# Patient Record
Sex: Male | Born: 1935 | Race: White | Hispanic: No | Marital: Married | Smoking: Never smoker
Health system: Southern US, Community
[De-identification: ages and names within clinical notes are randomized; demographics above are authoritative.]

## PROBLEM LIST (undated history)

## (undated) DIAGNOSIS — I639 Cerebral infarction, unspecified: Secondary | ICD-10-CM

## (undated) DIAGNOSIS — I482 Chronic atrial fibrillation, unspecified: Secondary | ICD-10-CM

## (undated) DIAGNOSIS — I259 Chronic ischemic heart disease, unspecified: Secondary | ICD-10-CM

## (undated) DIAGNOSIS — I10 Essential (primary) hypertension: Secondary | ICD-10-CM

## (undated) DIAGNOSIS — M25561 Pain in right knee: Secondary | ICD-10-CM

## (undated) HISTORY — DX: Chronic atrial fibrillation, unspecified: I48.20

## (undated) HISTORY — DX: Pain in right knee: M25.561

## (undated) HISTORY — PX: EAR CYST EXCISION: SHX22

## (undated) HISTORY — DX: Chronic ischemic heart disease, unspecified: I25.9

## (undated) HISTORY — DX: Essential (primary) hypertension: I10

## (undated) HISTORY — DX: Cerebral infarction, unspecified: I63.9

---
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
## 1994-02-12 HISTORY — PX: ROTATOR CUFF REPAIR: SHX139

## 1999-05-04 ENCOUNTER — Encounter: Payer: Self-pay | Admitting: Orthopedic Surgery

## 1999-05-04 ENCOUNTER — Encounter: Admission: RE | Admit: 1999-05-04 | Discharge: 1999-05-04 | Payer: Self-pay | Admitting: Orthopedic Surgery

## 2004-12-02 ENCOUNTER — Encounter: Admission: RE | Admit: 2004-12-02 | Discharge: 2004-12-02 | Payer: Self-pay | Admitting: Orthopedic Surgery

## 2004-12-06 ENCOUNTER — Encounter: Admission: RE | Admit: 2004-12-06 | Discharge: 2004-12-06 | Payer: Self-pay | Admitting: Orthopedic Surgery

## 2004-12-07 ENCOUNTER — Ambulatory Visit (HOSPITAL_BASED_OUTPATIENT_CLINIC_OR_DEPARTMENT_OTHER): Admission: RE | Admit: 2004-12-07 | Discharge: 2004-12-07 | Payer: Self-pay | Admitting: Orthopedic Surgery

## 2004-12-07 ENCOUNTER — Ambulatory Visit (HOSPITAL_COMMUNITY): Admission: RE | Admit: 2004-12-07 | Discharge: 2004-12-07 | Payer: Self-pay | Admitting: Orthopedic Surgery

## 2005-10-07 ENCOUNTER — Inpatient Hospital Stay (HOSPITAL_COMMUNITY): Admission: EM | Admit: 2005-10-07 | Discharge: 2005-10-17 | Payer: Self-pay | Admitting: Emergency Medicine

## 2005-10-08 ENCOUNTER — Encounter: Payer: Self-pay | Admitting: Vascular Surgery

## 2005-10-08 ENCOUNTER — Encounter: Payer: Self-pay | Admitting: Cardiology

## 2005-10-09 ENCOUNTER — Encounter: Payer: Self-pay | Admitting: Vascular Surgery

## 2005-11-15 ENCOUNTER — Encounter (HOSPITAL_COMMUNITY): Admission: RE | Admit: 2005-11-15 | Discharge: 2006-02-13 | Payer: Self-pay | Admitting: Cardiology

## 2006-02-14 ENCOUNTER — Encounter (HOSPITAL_COMMUNITY): Admission: RE | Admit: 2006-02-14 | Discharge: 2006-03-14 | Payer: Self-pay | Admitting: Cardiology

## 2006-10-16 IMAGING — CR DG CHEST 1V PORT
1 series · 1 of 1 positions shown · non-contrast
Comparison: 10/10/2005

CLINICAL DATA: Chest pain. CABG.

[view not recorded]
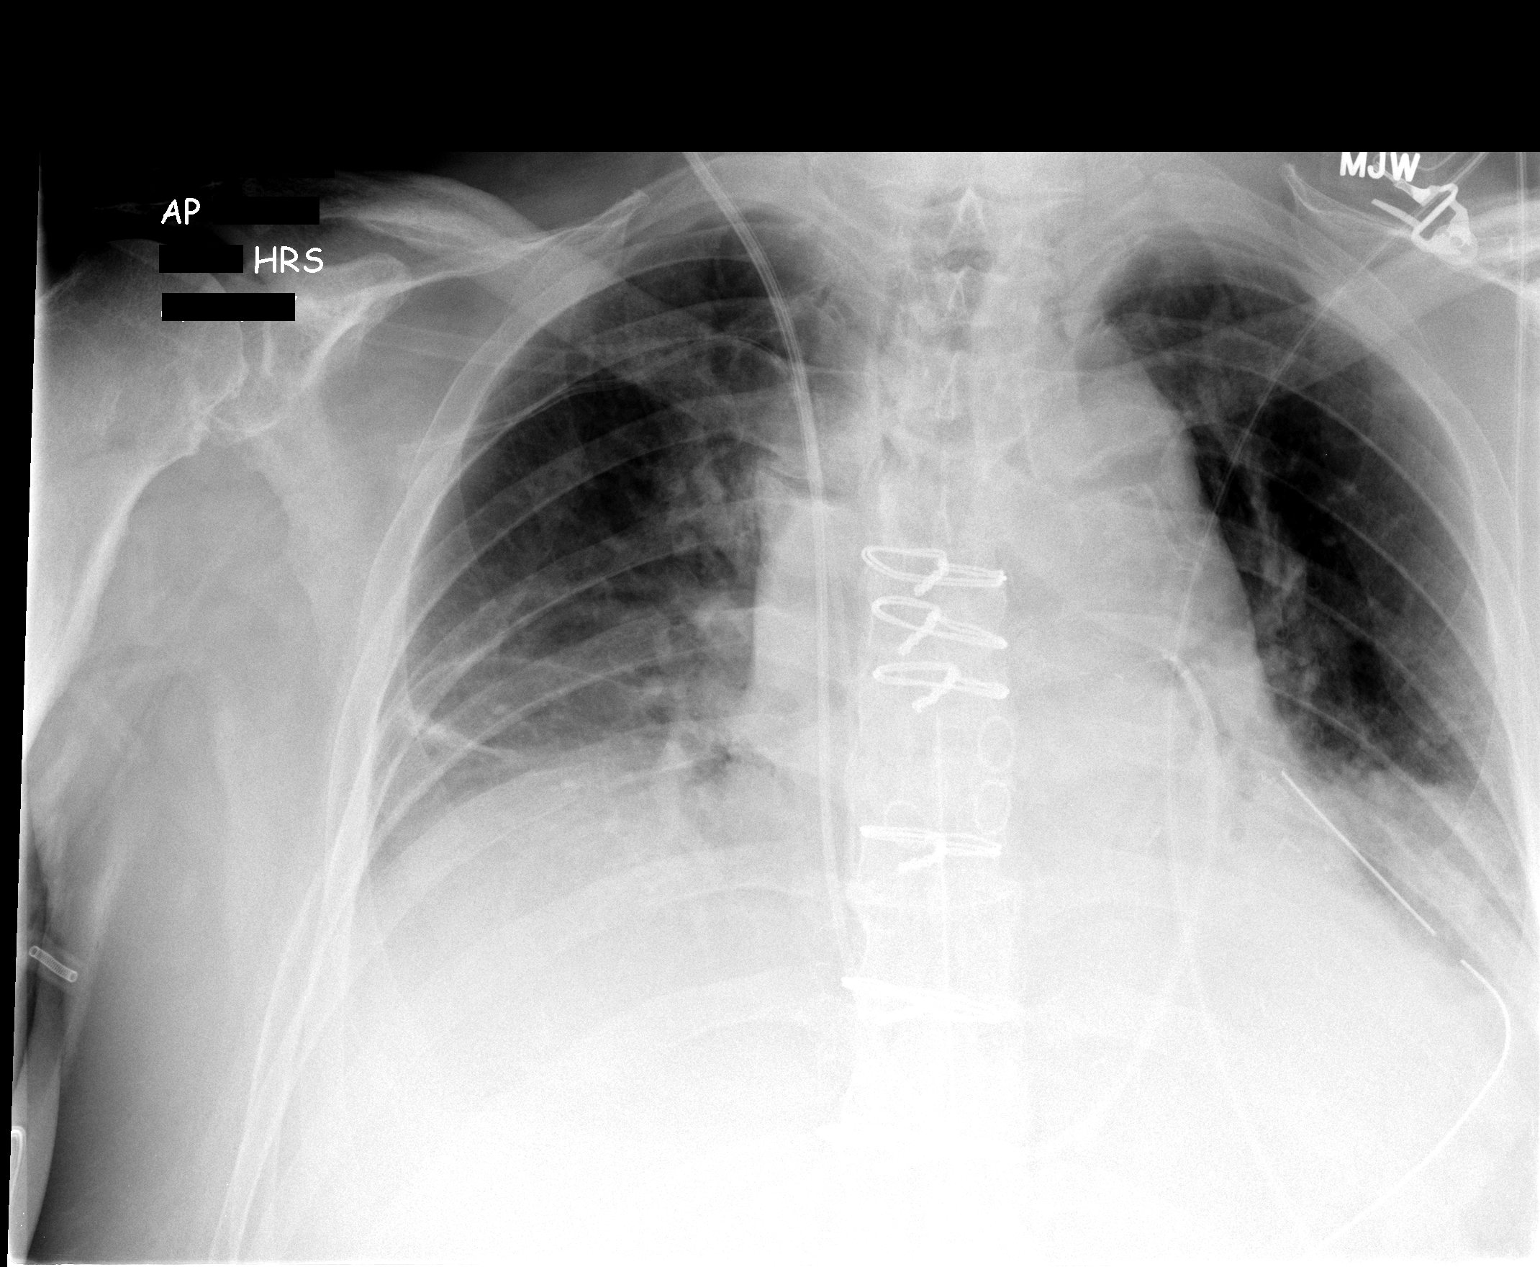

[1 of 1 positions shown; findings below may reference images not displayed]

PORTABLE CHEST - 1 VIEW:

9489 hours. Low volume film with bibasilar atelectasis. Endotracheal tube and NG
tube are removed in the interval. The left chest tube persists without
pneumothorax. Right IJ pulmonary catheter is stable. A single
mediastinal/pericardial drain is visible on this film. Right subclavian central
line remains in place.
IMPRESSION: Low volume film with mild vascular congestion and bibasilar atelectasis.

Interval extubation with removal of the NG tube.

## 2006-10-18 IMAGING — CR DG CHEST 2V
2 series · 2 of 2 positions shown · non-contrast
Comparison: 10/12/05.

CLINICAL DATA: Chest pain.  Post pleural tube removal. 
 CHEST ? 2 VIEW:

[view not recorded (1 of 2)]
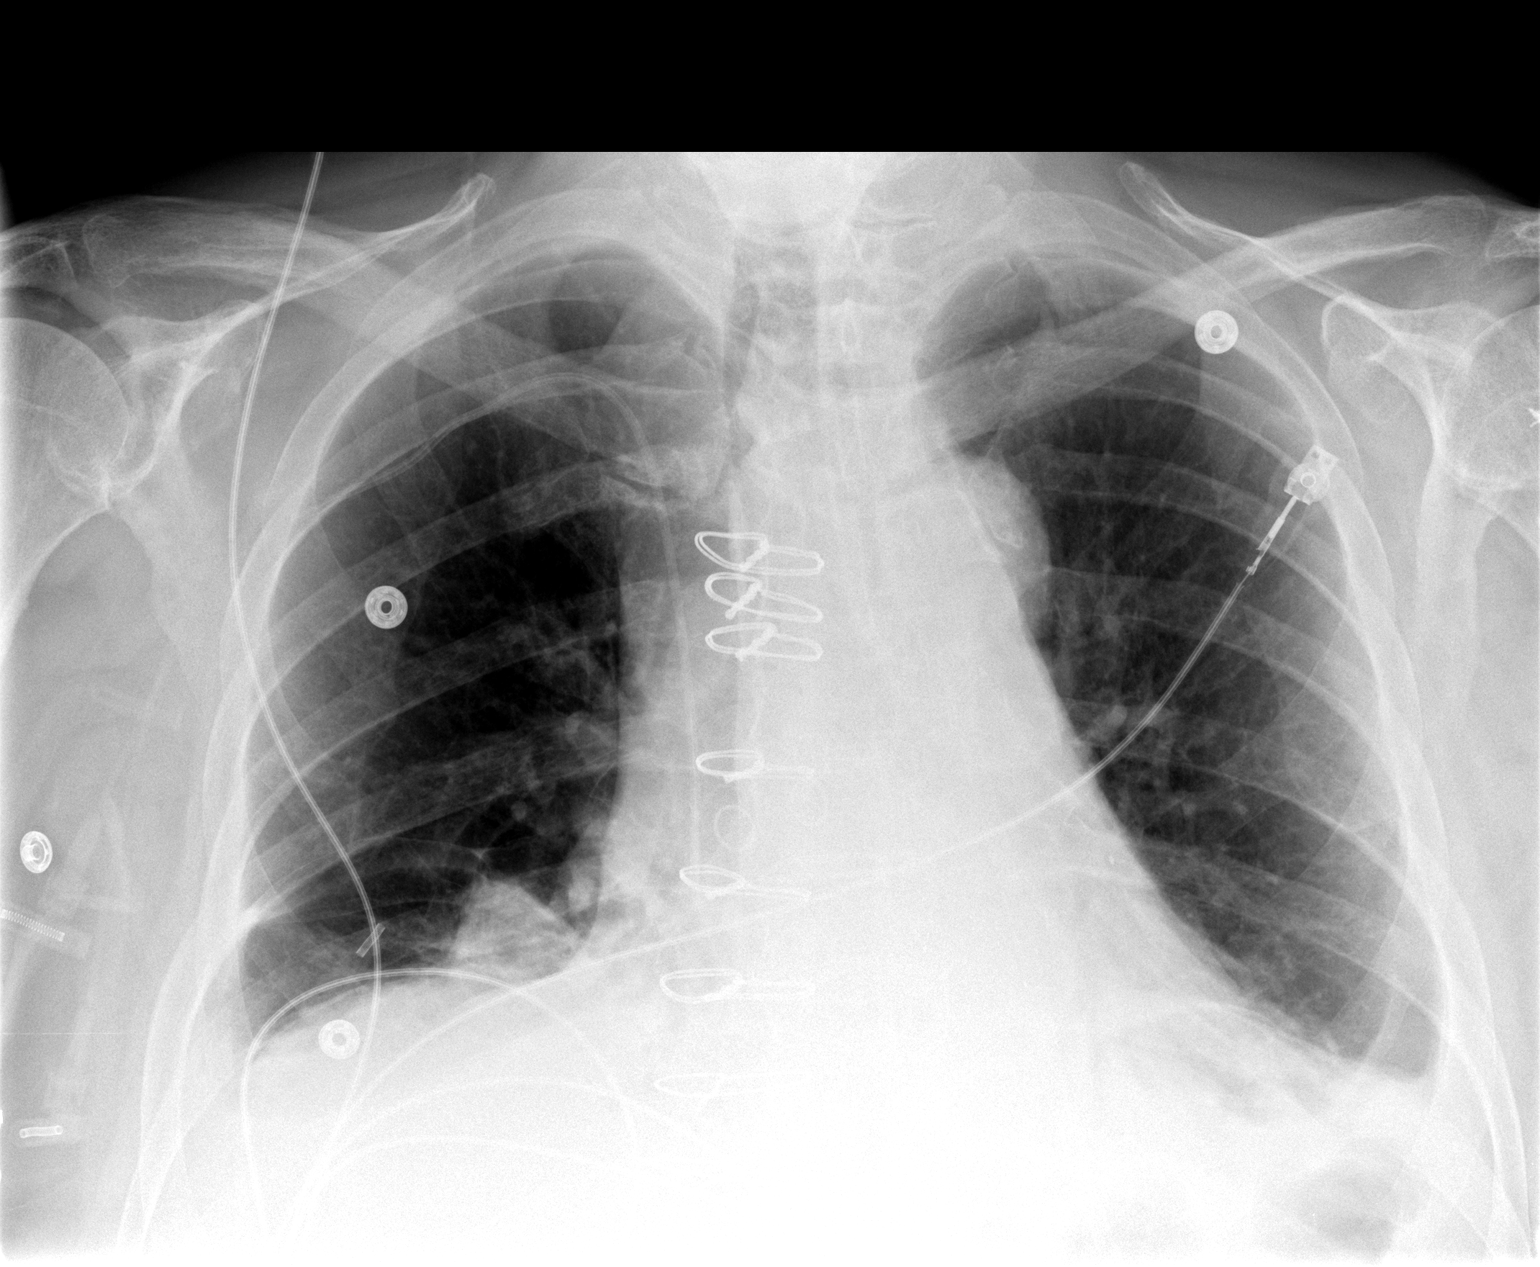

[view not recorded (2 of 2)]
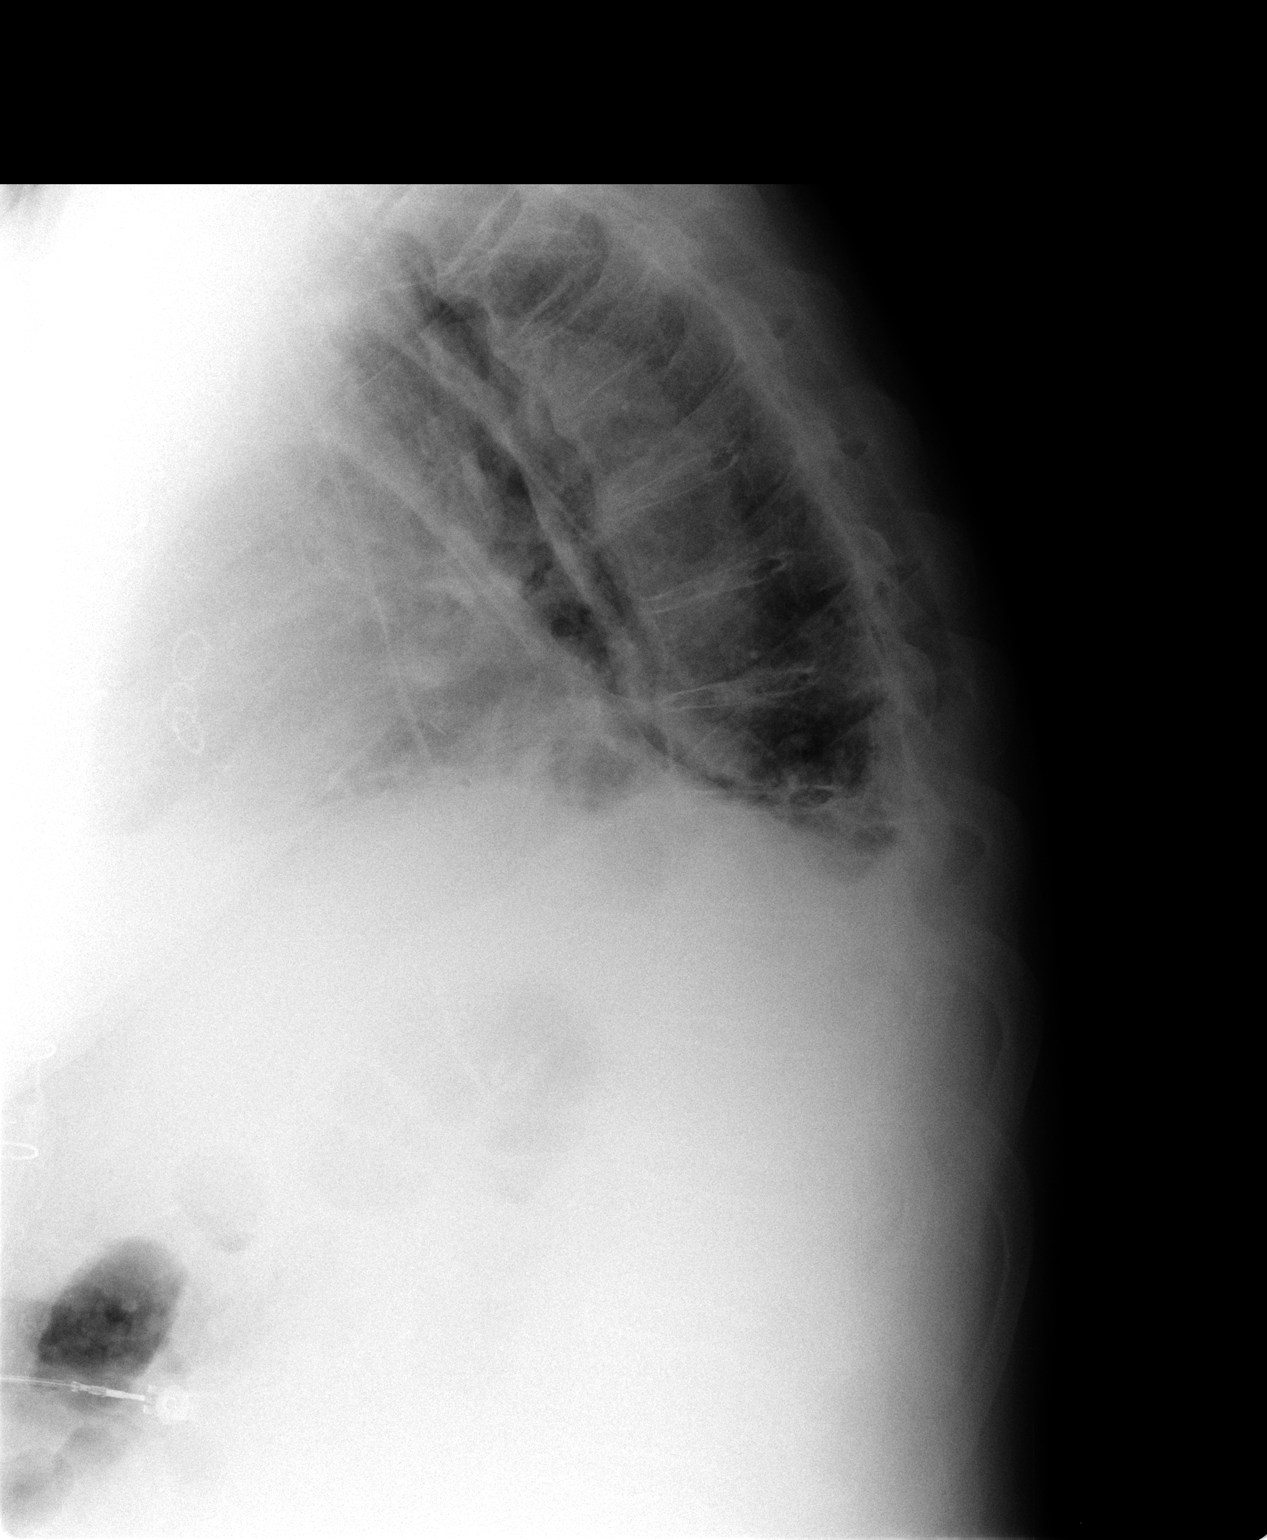

[2 of 2 positions shown; findings below may reference images not displayed]

FINDINGS: Right central venous catheter, cardiomegaly, and CABG again noted.  Improved basilar aeration with continued lower lung atelectasis/consolidation.  There is no evidence of pneumothorax.
IMPRESSION: Improved basilar aeration with stable small effusions.

## 2009-10-31 ENCOUNTER — Ambulatory Visit: Payer: Self-pay | Admitting: Cardiology

## 2009-12-15 ENCOUNTER — Inpatient Hospital Stay (HOSPITAL_COMMUNITY): Admission: EM | Admit: 2009-12-15 | Discharge: 2009-12-17 | Payer: Self-pay | Admitting: Emergency Medicine

## 2009-12-15 ENCOUNTER — Ambulatory Visit: Payer: Self-pay | Admitting: Cardiology

## 2009-12-16 ENCOUNTER — Encounter (INDEPENDENT_AMBULATORY_CARE_PROVIDER_SITE_OTHER): Payer: Self-pay | Admitting: Internal Medicine

## 2009-12-19 ENCOUNTER — Encounter (INDEPENDENT_AMBULATORY_CARE_PROVIDER_SITE_OTHER): Payer: Self-pay | Admitting: *Deleted

## 2010-03-04 ENCOUNTER — Encounter: Payer: Self-pay | Admitting: Orthopedic Surgery

## 2010-04-25 LAB — DIFFERENTIAL
Basophils Relative: 1 % (ref 0–1)
Eosinophils Absolute: 0.7 10*3/uL (ref 0.0–0.7)
Eosinophils Relative: 8 % — ABNORMAL HIGH (ref 0–5)
Lymphs Abs: 1.5 10*3/uL (ref 0.7–4.0)
Monocytes Relative: 9 % (ref 3–12)
Neutrophils Relative %: 66 % (ref 43–77)

## 2010-04-25 LAB — URINALYSIS, ROUTINE W REFLEX MICROSCOPIC
Glucose, UA: NEGATIVE mg/dL
Ketones, ur: NEGATIVE mg/dL
Nitrite: NEGATIVE
Specific Gravity, Urine: 1.017 (ref 1.005–1.030)
pH: 5.5 (ref 5.0–8.0)

## 2010-04-25 LAB — POCT CARDIAC MARKERS
CKMB, poc: <1 ng/mL — ABNORMAL LOW (ref 1.0–8.0)
Myoglobin, poc: 134 ng/mL (ref 12–200)
Troponin i, poc: <0.05 ng/mL (ref 0.00–0.09)

## 2010-04-25 LAB — CBC
MCH: 29.4 pg (ref 26.0–34.0)
MCHC: 32.8 g/dL (ref 30.0–36.0)
MCV: 89.5 fL (ref 78.0–100.0)
MCV: 89.8 fL (ref 78.0–100.0)
Platelets: 121 10*3/uL — ABNORMAL LOW (ref 150–400)
Platelets: 136 10*3/uL — ABNORMAL LOW (ref 150–400)
RBC: 4.51 MIL/uL (ref 4.22–5.81)
RDW: 14.8 % (ref 11.5–15.5)
RDW: 14.9 % (ref 11.5–15.5)
WBC: 9 10*3/uL (ref 4.0–10.5)

## 2010-04-25 LAB — PROTIME-INR
INR: 2.16 — ABNORMAL HIGH (ref 0.00–1.49)
Prothrombin Time: 24.2 seconds — ABNORMAL HIGH (ref 11.6–15.2)
Prothrombin Time: 31.3 seconds — ABNORMAL HIGH (ref 11.6–15.2)

## 2010-04-25 LAB — BASIC METABOLIC PANEL
BUN: 17 mg/dL (ref 6–23)
BUN: 18 mg/dL (ref 6–23)
CO2: 27 mEq/L (ref 19–32)
Calcium: 8.6 mg/dL (ref 8.4–10.5)
Calcium: 8.9 mg/dL (ref 8.4–10.5)
Chloride: 108 mEq/L (ref 96–112)
Creatinine, Ser: 1.02 mg/dL (ref 0.4–1.5)
Creatinine, Ser: 1.23 mg/dL (ref 0.4–1.5)
GFR calc Af Amer: >60 mL/min (ref >60)
Glucose, Bld: 94 mg/dL (ref 70–99)

## 2010-04-25 LAB — CK TOTAL AND CKMB (NOT AT ARMC)
CK, MB: 1.1 ng/mL (ref 0.3–4.0)
Relative Index: INVALID (ref 0.0–2.5)

## 2010-04-25 LAB — MAGNESIUM: Magnesium: 2.1 mg/dL (ref 1.5–2.5)

## 2010-04-25 LAB — BRAIN NATRIURETIC PEPTIDE: Pro B Natriuretic peptide (BNP): 129 pg/mL — ABNORMAL HIGH (ref 0.0–100.0)

## 2010-05-11 ENCOUNTER — Other Ambulatory Visit: Payer: Self-pay | Admitting: Cardiology

## 2010-05-24 ENCOUNTER — Encounter: Payer: Self-pay | Admitting: Cardiology

## 2010-05-24 ENCOUNTER — Ambulatory Visit (INDEPENDENT_AMBULATORY_CARE_PROVIDER_SITE_OTHER): Payer: Self-pay | Admitting: Cardiology

## 2010-05-24 DIAGNOSIS — I10 Essential (primary) hypertension: Secondary | ICD-10-CM | POA: Insufficient documentation

## 2010-05-24 DIAGNOSIS — Z9889 Other specified postprocedural states: Secondary | ICD-10-CM

## 2010-05-24 DIAGNOSIS — I4891 Unspecified atrial fibrillation: Secondary | ICD-10-CM

## 2010-05-24 DIAGNOSIS — Z951 Presence of aortocoronary bypass graft: Secondary | ICD-10-CM | POA: Insufficient documentation

## 2010-05-24 DIAGNOSIS — I6350 Cerebral infarction due to unspecified occlusion or stenosis of unspecified cerebral artery: Secondary | ICD-10-CM

## 2010-05-24 DIAGNOSIS — E119 Type 2 diabetes mellitus without complications: Secondary | ICD-10-CM | POA: Insufficient documentation

## 2010-05-24 DIAGNOSIS — I639 Cerebral infarction, unspecified: Secondary | ICD-10-CM | POA: Insufficient documentation

## 2010-05-24 NOTE — Progress Notes (Signed)
Subjective:   Ian Edwards is seen today for a followup of his chronic atrial fibrillation with Coumadin anticoagulation. He is had one or 2 falls but has not injured himself. He has a history of coronary artery bypass grafting in 2007. At that time, he had a LIMA graft to the LAD, vein graft to diagonal, vein graft to the circumflex, and a vein graft to the posterior descending. He did have a Maze procedure as well as left atrial appendage ligation. He has been a chronic atrial fibrillation on chronic warfarin. He had a stroke in August of 2007 the left parietal area. He has recovered. His other problems include hyperlipemia, BPH, hypertension, osteoarthritis, LVH, and mild valvular heart disease. His last stress study was in June of 2010. His last echo was in may of 2010. His ejection fraction was 57%. In general, he's been doing well. He has appointment to see Dr. Morene Rankins her cardiology followup. He sees Dr. Ivory Broad for primary care  Current Outpatient Prescriptions  Medication Sig Dispense Refill  . amLODipine (NORVASC) 5 MG tablet Take 5 mg by mouth daily.        . Ascorbic Acid (VITAMIN C PO) Take by mouth daily.        Marland Kitchen aspirin 81 MG tablet Take 81 mg by mouth daily.        . carvedilol (COREG) 12.5 MG tablet Take 12.5 mg by mouth daily.        . Cetirizine HCl (ZYRTEC PO) Take by mouth as needed.        . Coenzyme Q10 (CO Q 10 PO) Take by mouth daily.        . DiphenhydrAMINE HCl, Sleep, (SOMINEX PO) Take by mouth as needed.        . doxazosin (CARDURA) 2 MG tablet TAKE 1 TABLET EVERY DAY  90 tablet  12  . fish oil-omega-3 fatty acids 1000 MG capsule Take by mouth daily.        . furosemide (LASIX) 40 MG tablet Take 40 mg by mouth daily.        Marland Kitchen lisinopril (PRINIVIL,ZESTRIL) 20 MG tablet Take 20 mg by mouth daily.        . Misc Natural Products (COLON CLEANSER PO) Take by mouth as needed.        . Naproxen Sodium (ALEVE PO) Take by mouth as needed.        . Oxymetazoline HCl (NASAL SPRAY  NA) by Nasal route as needed.        . potassium chloride SA (K-DUR,KLOR-CON) 20 MEQ tablet Take 20 mEq by mouth 2 (two) times daily.        . simvastatin (ZOCOR) 40 MG tablet Take 40 mg by mouth at bedtime.        . TH GINKGO BILOBA PO Take by mouth daily.        Marland Kitchen warfarin (COUMADIN) 5 MG tablet Take 5 mg by mouth daily. As directed         Allergies  Allergen Reactions  . Niacin And Related     There is no problem list on file for this patient.   History  Smoking status  . Not on file  Smokeless tobacco  . Not on file    History  Alcohol Use: Not on file    No family history on file.  Review of Systems:   The patient denies any heat or cold intolerance.  No weight gain or weight loss.  The patient denies headaches  or blurry vision.  There is no cough or sputum production.  The patient denies dizziness.  There is no hematuria or hematochezia.  The patient denies any muscle aches or arthritis.  The patient denies any rash.  The patient denies frequent falling or instability.  There is no history of depression or anxiety.  All other systems were reviewed and are negative.   Physical Exam:   His blood pressure is 115/80. Heart rate is 82. Weight is 254.The head is normocephalic and atraumatic.  Pupils are equally round and reactive to light.  Sclerae nonicteric.  Conjunctiva is clear.  Oropharynx is unremarkable.  There's adequate oral airway.  Neck is supple there are no masses.  Thyroid is not enlarged.  There is no lymphadenopathy.  Lungs are clear.  Chest is symmetric.  Heart shows a irregular rate and rhythm.  S1 and S2 are normal.  There is no murmur click or gallop.  Abdomen is soft normal bowel sounds.  There is no organomegaly.  Genital and rectal deferred.  Extremities are with1+edema.  Peripheral pulses are adequate.  Neurologically intact.  Full range of motion.  The patient is not depressed.  Skin is warm and dry.  Assessment / Plan:

## 2010-05-24 NOTE — Assessment & Plan Note (Signed)
He remains on warfarin anticoagulation. It's interesting that he had a stroke in 2009 after having his left atrial appendage ligated at the time of his coronary artery bypass surgery in 2007.overall, he's had satisfactory rate control and is asymptomatic from his atrial fibrillation.

## 2010-05-24 NOTE — Assessment & Plan Note (Signed)
He a coronary artery bypass grafting in 2007. He had a LIMA to the LAD, vein graft to diagonal, vein graft to the circumflex, and a vein graft to the posterior descending. He has done well since that time

## 2010-05-24 NOTE — Assessment & Plan Note (Signed)
He had an old left parietal CVA in 2009 with residual right leg weakness is essentially resolved. It may be the source for some of his unsteady gait

## 2010-05-24 NOTE — Assessment & Plan Note (Signed)
We'll maintain current medicines. Blood pressure initially was elevated but followup blood pressures were satisfactory by me.

## 2010-06-30 NOTE — Op Note (Signed)
NAME:  Ian Edwards, Ian Edwards NO.:  [id number]   MEDICAL RECORD NO.:  [id number]          PATIENT TYPE:  AMB   LOCATION:  DSC                          FACILITY:  [facility]   PHYSICIAN:  Rodney A. Mortenson, M.D.DATE OF BIRTH:  Jun 04, 1931   DATE OF PROCEDURE:  12/07/2004  DATE OF DISCHARGE:                                 OPERATIVE REPORT   JUSTIFICATION:  77 year old male with chronic knee pain on both medial and  lateral sides of the knee.  MRI shows extremely complex tear of the lateral  meniscus and early osteoarthritis of the knee.   PREOPERATIVE DIAGNOSIS:  Complex tear lateral meniscus of the left knee.   POSTOPERATIVE DIAGNOSIS:  Complex tear lateral meniscus of the left knee,  osteoarthritis of the left knee, loose bodies of the left knee.   OPERATION:  Subtotal lateral meniscectomy, removal of loose bodies,  chondroplasty of the medial femoral condyle, left knee.   SURGEON:  Lenard Galloway. Chaney Malling, M.D.   ANESTHESIA:  MAC converted to general.   PATHOLOGY:  With the arthroscope in the knee, a very careful examination of  the knee was undertaken.  There was some early cartilage changes behind the  patella and in the femoral notch area.  In the medial compartment, the  medial meniscus was intact but there was a huge area of a chondral defect  over the posterior aspect of the medial femoral condyle in the weight-  bearing area.  The anterior cruciate ligament appeared normal.  The anterior  aspect of the lateral meniscus looked markedly frayed and torn, there was a  larger loose body trapped in this area.  The rest of the lateral meniscus  was torn all the way to its posterior attachment, this was a very complex  tear of the meniscus, itself.  No significant changes over the lateral  femoral condyle.  There was a large area about the lateral tibial plateau  where there was an area of total loss of articular cartilage and were all  bone exposed.   PROCEDURE:  The  patient was placed on the operating table in supine position  with a pneumatic tourniquet about the left thigh.  The left leg was placed  in a leg holder and the entire left lower extremity was prepped with  DuraPrep and draped out in the usual manner.  Marcaine was placed in the  knee and Xylocaine and epinephrine used to infiltrate the puncture wounds.  An infusion cannula was placed in the superomedial pouch and the knee  distended with saline.  Anteromedial and anterolateral portals was made.  The findings were as described above.   Attention was turned first to the medial compartment.  There was a large  area of cartilage off the posterior aspect of the medial femoral condyle was  frayed and torn and cartilage was shedding.  The chondroplasty shaver was  introduced and this area was completely debrided down to stable cartilage.  The arthroscope was then passed into the intercondylar notch and there was a  very large loose body in this area and this was removed with a pituitary  rongeur.  The arthroscope was then placed in the lateral compartment.  Visualization was difficult because the anterior horn was frayed and torn.  This was debrided with the chondroplasty shaver and then the arthroscope  could be admitted into the lateral compartment, itself.  There is a large  area of total loss of articular cartilage off the mid portion of the lateral  tibial plateau.  There was complex fraying and tearing of the lateral  meniscus all the way from the anterior horn to the posterior horn.  Through  both portals, a series of baskets were inserted and the meniscus was  debrided very aggressively.  The chondroplasty shaver was introduced and the  loose debris removed and the remaining rim was then smoothed and balanced.  A great deal of the time was spent cleaning this area up.  The knee was then  filled with Marcaine.  A large, bulky pressure dressing was applied and the  patient returned to the  recovery room in excellent condition.  Technically,  this procedure went extremely well.   FOLLOW UP CARE:  1.  To my office on Wednesday.  2.  This patient probably will require total knee sometime in the future.           ______________________________  Lenard Galloway Chaney Malling, M.D.     RAM/MEDQ  D:  12/07/2004  T:  12/07/2004  Job:  [id number]

## 2010-06-30 NOTE — Discharge Summary (Signed)
NAME:  Ian Edwards, Ian Edwards NO.:  [id number]   MEDICAL RECORD NO.:  [id number]          PATIENT TYPE:  INP   LOCATION:  2014                         FACILITY:  [facility]   PHYSICIAN:  Kerin Perna, M.D.  DATE OF BIRTH:  11-Oct-1931   DATE OF ADMISSION:  10/07/2005  DATE OF DISCHARGE:  10/17/2005                                 DISCHARGE SUMMARY   HISTORY OF PRESENT ILLNESS:  The patient is a 77 year old white male from  [name], [place], who was admitted with chest pain and elevated  troponin.  He has no prior known coronary artery disease and had a normal  Cardiolite study in 2004.  He does have a past history of atrial  fibrillation in 1996 requiring DC cardioversion and has been in normal sinus  rhythm every since on Tambocor 50 mg twice daily.  He was last seen in Dr.  [name] office on October 18, 2004.  On the date prior to admission, he  was awoken with severe dyspnea and pleuritic chest pain.  The pain was worse  with deep breathing and better with sitting up.  He also noted a low grade  fever.  He had no recent URI symptoms.  He does have a history of  polymyalgia rheumatica.  He was transferred to [facility] for  further evaluation and treatment.   MEDICATIONS PRIOR TO ADMISSION:  1. Flecainide 50 mg b.i.d.  2. Atenolol 100 mg daily.  3. Generic Maxzide 7.5/50 mg once daily.  4. Doxepin 2 mg daily.  5. Aspirin 325 mg daily.   ALLERGIES:  NO KNOWN DRUG ALLERGIES.   Family history, social history, review of symptoms and physical exam please  see the history and physical done at the time of admission.   STUDIES:  Note that the patient had a CT angio study at [facility]  and this revealed no pulmonary emboli.  There is also no evidence of aortic  dissection.  There was positive findings of coronary calcifications noted.  His CK was 257 and troponin I was 7.56.  He was seen by Dr. [name] and  felt to require admission with the  presenting diagnosis of chest pain  suggestive of acute pericarditis, rule out non-Q wave myocardial infarction.  Plan was to admit for telemetry with serial enzymes and nonsteroidal  antiinflammatories, as well as Protonix for GI protection.  He was continued  on intravenous nitroglycerin, which was started at [facility] and  consideration of cardiac catheterization would be undertaken depending on  his clinical course.   HOSPITAL COURSE:  The pain did show improvement with initial treatment, but  still remained presented with deep inspiration.  The patient's troponin came  down to a 4.15.  On October 08, 2005 it was Dr. [name] opinion that he  should undergo cardiac catheterization and this was undertaken on that date.  Findings revealed left ventricular inferior hypokinesis with an ejection  fraction of 50%.  There was multivessel coronary artery disease, including  90% proximal LAD, 70% to 80% proximal left circumflex, 95% lesion in the  second obtuse marginal and 100% lesion  in the right coronary artery with  left to right collateralization.  Additionally, the LAD showed a moderate  ostial stenosis as well.  The patient was also noted to now be in atrial  fibrillation at the time of this study.  Impression, in that study, in  addition to the 3-vessel disease was inferior myocardial infarction and Dr.  [name] was consulted for surgical opinion.  On October 08, 2005, the  patient was seen by Dr. [name], who recommended the following:  It was  his opinion that he would benefit from coronary artery bypass grafting with  a MAZE procedure for the paroxysmal atrial fibrillation.  The patient was  subsequently scheduled for the procedure and on October 10, 2005 the patient  was taken to the operating room, at which time he underwent the following  procedure:  Coronary artery bypass grafting x4.   The following grafts were placed:  1. Left internal mammary artery to the LAD.   2. Saphenous vein graft to the posterior descending.  3. Saphenous vein graft to the obtuse marginal.  4. Saphenous vein graft to the diagonal.   In addition, the patient underwent a left-sided MAZE procedure, as well as  stapling of the left atrial appendage.  The patient was transferred to the  surgical intensive care unit in stable condition in normal sinus rhythm.   POSTOPERATIVE HOSPITAL COURSE:  Patient has done well over all.  Initially,  he did require some pressure support, but these were weaned without  significant difficulties.  He has remained hemodynamically stable; however,  did revert back to atrial fibrillation during the postoperative period.  The  patient's rate is currently controlled on amiodarone, but it was felt he  would benefit from Coumadin so as to lower his risk of embolic event.  The  patient has had all routine lines, monitors and drainage devices  discontinued in the standard fashion.  He is noted to have a mild  postoperative anemia.  His most recent hemoglobin and hematocrit, dated  October 14, 2005, were 10.3 and 30.1 respectively.  His electrolytes, BUN  and creatinine are within normal limits.  He is responding well to a general  diuresis.  He is noted to have elevated capillary blood glucose measurements  during the postoperative period and has been treated with insulin therapy,  specifically Lantus at night, as well as a sliding scale regular insulin.  A  determination will have to be made prior to actual discharge as to whether  he will require medications related to his diabetes prior to his discharge.  It is indeterminate at the time of this dictation as to the current plans on  this.  Currently, the patient is felt to be stable for tentative discharge  in the morning of October 17, 2005 pending morning round reevaluation.   MEDICATIONS AT TIME OF DISCHARGE:  Will be:  1. Aspirin 81 mg daily.  2. Toprol-XL 25 mg daily. 3. Lipitor 20 mg q.h.s.   4. Doxazosin 2 mg daily.  5. Amiodarone 200 mg two tablets twice a day for 2 weeks, then one tablet      2 times a day.  6. Cardizem CD 180 mg.  7. Coumadin dose is to be determined at time of discharge.  8. Lasix and potassium dosage to be determined at time of discharge, as      well as duration.  9. Xopenex HFA 2 puffs every 6 hours p.r.n.  10.For pain Tylox one or two  every 4 to 6 hours as needed.   INSTRUCTIONS:  The patient received written instructions regarding  medications, activity, diet, wound care and followup.   FOLLOWUP:  Will include Dr. [name] office for a cardiology followup, as  well as Coumadin maintenance.  Additionally, he will see Dr. [name] 3  weeks from discharge, an appointment will be arranged for the patient and  called to him.   FINAL DIAGNOSES:  Include the following:  1. Severe 3-vessel coronary artery disease, now past surgical      revascularization as described.  2. Additionally, paroxysmal atrial fibrillation now status post left-sided      MAZE procedure with postoperative atrial fibrillation currently rate      controlled on Coumadin, amiodarone and beta blocker.  3. Volume overload, stable with ongoing diuretics.  4. Diabetes mellitus.  5. Remote history of polymyalgia rheumatica.  6. Remote history of previous paroxysmal atrial fibrillation in 1996 with      DC cardioversion.  7. History of hypertension.  8. History of benign prostatic hyperplasia.  9. History of peripheral neuropathy.  10.Postoperative anemia.      Rowe Clack, P.A.-C.      Kerin Perna, M.D.  Electronically Signed    WEG/MEDQ  D:  10/16/2005  T:  10/16/2005  Job:  [id number]   cc:   Colleen Can. Deborah Chalk, M.D.  Nadine Counts

## 2010-06-30 NOTE — Op Note (Signed)
NAME:  Ian Edwards, Ian Edwards NO.:  [id number]   MEDICAL RECORD NO.:  [id number]          PATIENT TYPE:  INP   LOCATION:  2311                         FACILITY:  [facility]   PHYSICIAN:  Kerin Perna, M.D.  DATE OF BIRTH:  1931-04-12   DATE OF PROCEDURE:  DATE OF DISCHARGE:                                 OPERATIVE REPORT   OPERATION:  1. Coronary artery bypass grafting x4 (left internal mammary artery to      LAD, endoscopically harvested saphenous vein graft to the first      diagonal, the circumflex marginal, and the posterior descending).  2. Maze procedure.  3. Ligation of left atrial appendage.   PRE-AND-POSTOPERATIVE DIAGNOSIS:  Severe 3-vessel coronary disease with  unstable angina, paroxysmal atrial fibrillation, status post inferior  myocardial infarction.   SURGEON:  Kerin Perna, M.D.   ASSISTANT:  Sheliah Plane, MD and Theda Belfast, PA-C   ANESTHESIA:  General.   INDICATIONS:  The patient is a 77 year old male who presented with unstable  angina and negative cardiac enzymes.  A 2-D echo demonstrated inferior wall  hypokinesia.  He underwent cardiac catheterization by Dr. Deborah Chalk which  demonstrated severe 3-vessel coronary disease with total occlusion of the  right coronary, 90% stenosis of the LAD diagonal, and 95% stenosis of the  circumflex with ejection fraction of 50%.  The patient had a history of  paroxysmal atrial fibrillation and had been treated with DC cardioversion in  the past.  Because of his coronary anatomy and symptoms, it was felt that he  would benefit from surgical coronary revascularization with a combined Maze  procedure.   Prior to surgery I reviewed results of the cardiac catheterization and 2-D  echo with the patient and family.  I discussed the indications and expected  benefits of coronary bypass surgery and Maze procedure for treatment of his  coronary artery disease.  I reviewed the alternatives to surgical  therapy as  well.  I reviewed with the patient and family the major details of the  operation; including the location of the surgical incisions, the choice of  conduit to include mammary artery and endoscopically harvested saphenous  vein, use of cardiopulmonary bypass, and the expected postoperative hospital  recovery period.  I reviewed with the patient and family the risks to the  patient of coronary artery bypass surgery and Maze procedure including risks  of MI, CVA, bleeding, blood transfusion requirement, infection, pacemaker  requirement, and death.  He understood these implications for this surgery  and agreed to proceed with the operation as planned under what I felt was an  informed consent.   OPERATIVE FINDINGS:  The patient's body habitus made exposure of the  posterior aspect of the heart difficult.  The coronaries were heavily  calcified.  There was evidence of inferior wall scarring which was well.  The patient was given the aprotinin protocol to reduce bleeding and  transfusion requirement.  His preoperative creatinine clearance was normal.  The patient did not require any blood products except for 1 unit of  platelets at the end of the procedure after  reversal of heparin with  protamine due to a marginally low platelet count of 110,000.   DESCRIPTION OF PROCEDURE:  The patient was brought to operating room and  placed supine on the operating table and general anesthesia was induced.  The anesthesiologist asked me to place a subclavian triple-lumen catheter  for IV access as they had difficulty with peripheral IV access.  This was  performed via the right subclavian vein over a guidewire.   The sternotomy was then made as the saphenous vein was harvested  endoscopically from the right leg.  The left internal mammary artery was  harvested as a pedicle graft from its origin at the subclavian vessels.  The  sternal retractor was placed and the pericardium was opened and  elevated.  Heparin was administered and the ACT was documented as being therapeutic.  Pursestrings were placed in the ascending aorta; and right atrium; and the  patient was cannulated and placed on bypass.  A second pursestring was  placed in the inferior right atrium for bicaval atrial drainage.  The  coronaries were dissected for grafting and identified.  The mammary artery  and vein grafts were prepared for the distal anastomoses.  Cardioplegic  catheters were placed for both antegrade aortic and retrograde coronary  sinus cardioplegia.  The interatrial groove was dissected out for the Maze  procedure, ablation.  The patient was cooled to 32 degrees.  The aortic  crossclamp was applied.  Then 800 mL of cold blood cardioplegia was  delivered, and the patient achieved a good cardioplegic arrest with septal  temperature dropping less than 15 degrees.  Topical iced saline was used to  augment myocardial preservation.  After cardioplegic arrest.  The left-sided  Maze procedure was performed.  A bipolar radiofrequency clamp was placed  across the atrial appendage and an ablation line was placed across the base  of the atrial appendage.   Next the left-sided pulmonary veins were dissected from the pulmonary artery  and encircled with a vessel loop.  The bipolar clamp was then applied around  the left-sided pulmonary veins with a cuff of left atrium.  An ablation line  was effectively created.   Next, the right-sided pulmonary veins were freed from the pericardial  reflection; and the right main pulmonary artery.  A vessel loop was placed  around the right-sided pulmonary veins; and the bipolar clamp was applied  for an ablation line which included the cuff of the left atrium on the right  side of the heart.  Next, the left atrial appendage was stapled with a  vascular stapler to obliterate the lumen.  This completed the left-sided Maze procedure.   Cardioplegia was redosed every 20  minutes during crossclamp.  The distal  coronary anastomoses were performed.  The first distal anastomosis was to  the very distal portion of the posterior descending.  This was a heavily  diseased vessel 100% occluded proximally.  A reverse saphenous vein was sewn  end-to-side with running 7-0 Prolene.  There was good flow through the  graft.  The second distal anastomosis was at the circumflex marginal.  This  a 1.75-mm vessel with a proximal 90% stenosis.  A reverse saphenous vein was  sewn end-to-side with a running 7-0 Prolene.  There was excellent flow  through the graft.  Cardioplegia was redosed.   The third distal anastomosis was to the first diagonal branch to the LAD.  This had proximal, heavily calcified, 80% stenosis.  A reverse saphenous  vein  was sewn end-to-side with a running 7-0 Prolene. There was good flow  through the graft.  The fourth distal anastomosis was to the distal LAD.  This was a 1.5-to-1.7-mm vessel with proximal 90% stenosis.  The left  internal mammary artery pedicle was brought through an opening, created in  the left lateral pericardium; and was brought down onto the LAD and sewn end-  to-side with running 8-0 Prolene.  There was excellent flow through the  anastomosis after briefly releasing the pedicle clamp on the mammary vessel.  The bulldog was replaced and the pedicle secured to the epicardium.  Cardioplegia was redosed.   While the crossclamp was still in place, 3 proximal vein anastomoses were  performed on the ascending aorta using a 4.5-mm punch.  Running 6-0 Prolene  was used to construct the 3 proximal anastomosis.  Prior to tying down the  final proximal anastomosis, air was vented from the left side of heart and  the coronaries using the usual maneuvers on bypass; and releasing the  bulldog clamp on the mammary pedicle.  The final anastomosis was tied and  the crossclamp was removed.   The heart resumed a spontaneous rhythm.  Air was  aspirated from the vein  grafts with 27-gauge needle.  The vein grafts were opened.  Each had good  flow.  Hemostasis was documented at the proximal and distal coronary  anastomoses.  The patient was rewarmed to 37 degrees.  The cardioplegia  catheters were removed.  Temporary pacing wires were applied.  The lungs re-  expanded and the ventilator was resumed.  When the patient was rewarmed, he  was weaned successfully from bypass on renal dose dopamine.  Blood pressure  and cardiac output were stable.  Protamine was administered without adverse  reaction.  The cannulas were removed.  The mediastinum was irrigated with  warm antibiotic irrigation.  The leg incision was irrigated and closed in a  standard fashion.  The superior pericardial fat was closed over the aorta.  Two mediastinal and a left pleural chest tube were placed and brought out through separate incisions.  The sternum was closed with double reinforced  heavy gauge stainless steel wire.  The pectoralis fascia was closed with a  running #1 Vicryl and subcutaneous and skin layers were closed with a  running Vicryl.  Total bypass time was 144 minutes.  The patient returned  the ICU in stable condition.  A transesophageal echo study performed by the  anesthesiologist showed inferior wall hypokinesia with mild-to-moderate  mitral regurgitation at the beginning of the operation when the PA pressures  were elevated.  After termination of cardiopulmonary bypass the mitral  regurgitation had been resolved and the overall global LV function had  improved.      Kerin Perna, M.D.  Electronically Signed     PV/MEDQ  D:  10/10/2005  T:  10/11/2005  Job:  [id number]   cc:   Colleen Can. Deborah Chalk, M.D.  [facility]

## 2010-06-30 NOTE — Cardiovascular Report (Signed)
NAME:  [name], [name] NO.:  [id number]   MEDICAL RECORD NO.:  [id number]          PATIENT TYPE:  INP   LOCATION:  2003                         FACILITY:  [facility]   PHYSICIAN:  Colleen Can. Deborah Chalk, M.D.DATE OF BIRTH:  1931/04/28   DATE OF PROCEDURE:  10/08/2005  DATE OF DISCHARGE:                              CARDIAC CATHETERIZATION   HISTORY:  Ian Edwards is a 77 year old male referred for catheterization  because of chest pain and subsequent elevation of cardiac enzymes.  EKG  showed changes compatible with acute inferior infarction.   PROCEDURE:  Left heart catheterization with selective coronary angiography,  left ventricular angiography.   TYPE AND SITE OF ENTRY:  Percutaneous, right femoral artery with Angioseal.   CATHETERS:  6-French 4 curved Judkins right coronary catheter, 6-French 5  curved Judkins left coronary catheter, 6-French pigtail ventriculographic  catheter.   CONTRAST:  Pure Omnipaque.   COMMENT:  The patient tolerated the procedure well.   HEMODYNAMIC DATA:  The aortic pressure was 86/61, LV was 87/9-12.  There was  no aortic valve gradient noted on pullback.   ANGIOGRAPHIC DATA:  1. Left main coronary artery is short but normal.  2. The left anterior descending. Left anterior descending had moderate 50-      60% ostial stenosis.  In its proximal portion, there was a calcified      segment of vessel and an 80%-90% stenosis.  The distal left anterior      descending had irregularities, but it would be a good vessel for bypass      grafting.  3. The right coronary artery was totally occluded at the level of the      acute margin.  There were distal collaterals into the right coronary      artery by way of the left-to-right collaterals.  4. Left circumflex. Left circumflex had a short segment of 80% narrowing.      This was in the proximal portion of the vessel prior to its      continuation as the largest of the obtuse marginals.   After this      stenosis, there was another obtuse marginal that had a 95% ostial      stenosis.   Left ventricular angiogram was performed in the RAO position.  Overall  cardiac size was normal.  The global ejection fraction was estimated to be  50%.  There was inferior basilar hypokinesia.  There was no mitral  regurgitation, intracardiac calcification, or intracavitary filling defect.   OVERALL IMPRESSION:  1. Probable recent inferior myocardial infarction.  2. Totally occluded right coronary artery, with left-to-right collaterals.  3. Severe stenosis in the left anterior descending, including ostial      stenosis and a severe proximal stenosis, as well      as a severe stenosis in the left circumflex and obtuse marginal.  4. In light of the multitude of lesions, it is felt that Ian Edwards      will be best served with coronary artery bypass grafting.      Colleen Can. Deborah Chalk, M.D.  Electronically Signed  SNT/MEDQ  D:  10/08/2005  T:  10/09/2005  Job:  [id number]   cc:   Nadine Counts

## 2010-06-30 NOTE — Op Note (Signed)
NAME:  NEMIAH, KISSNER NO.:  [id number]   MEDICAL RECORD NO.:  [id number]          PATIENT TYPE:  INP   LOCATION:  2311                         FACILITY:  [facility]   PHYSICIAN:  Zenon Mayo, MDDATE OF BIRTH:  [birth year]   DATE OF PROCEDURE:  DATE OF DISCHARGE:                                 OPERATIVE REPORT   INTRAOPERATIVE ECHOCARDIOGRAM REPORT   DATE OF PROCEDURE:  October 10, 2005.   PROCEDURE:  Intraoperative transesophageal echocardiogram.   DESCRIPTION OF PROCEDURE:  Mr. Weldy is a 77 year old gentleman with  history of hypertension, coronary artery disease who was brought to the  operating room today by Dr. [name] for coronary artery bypass grafting.  Intraoperative echocardiogram was requested to further evaluate left  ventricular function as well as valvular function. The patient was brought  to the operating room and placed under general anesthesia. After  confirmation of endotracheal tube placement and orogastric suctioning, a  transesophageal echo probe was placed in the patient's esophagus without  complication. The four-chamber view of the heart was imaged first and  revealed no pericardial or pleural effusions. The left ventricle was then  imaged and revealed a globally hypokinetic left ventricle with moderate  hypertrophy. The ejection fraction was estimated to be 45-50%. There were no  segmental wall motion abnormalities seen. The mitral valve was imaged next  and revealed a mildly calcified mitral annulus. The mitral leaflets appeared  to move well as well as appeared to coapt well. When color Doppler was  placed across the valve, a mild to moderate amount of mitral regurgitation  was seen. The aortic valve was thickened. It was trileaflet in nature. There  did not appear to be any calcification or vegetations on the aortic valve.  The aortic valve area was measured by planimetry to be 2.47 sq cm. When  color Doppler was placed on  the valve, a trace amount of central aortic  insufficiency was noted. The pulmonic valve was difficult to visualize.  However, with the use of color Doppler trace pulmonic regurgitation was  seen. The tricuspid valve was then visualized and revealed normal structure  and function. The interatrial septum was intact. No PFO could be visualized.   At the conclusion of cardiopulmonary bypass and having instituted inotropic  and chronotropic support using milrinone and dopamine, the patient was  weaned off of bypass without complication.  There was difficulty obtaining a  deep transgastric view of the left ventricle after bypass so it was  difficult to assess any difference in left ventricular function. Mitral  valve was again examined. The mitral valve was again assessed. There was no  change of mitral valvular function. However, there did appear to be a  reduction in the amount of mitral regurgitation seen. The remainder of the  heart exam was unchanged from prebypass values. At the conclusion of the  procedure the transesophageal echo probe was removed from the patient's  esophagus without complication or evidence of trauma. The patient was taken  from the operating room directly to the intensive care unit in stable  condition.  ______________________________  Zenon Mayo, MD    WEF/MEDQ  D:  10/10/2005  T:  10/11/2005  Job:  [id number]   cc:   Anesthesia office

## 2010-06-30 NOTE — Consult Note (Signed)
NAME:  Ian Edwards, Ian Edwards NO.:  [id number]   MEDICAL RECORD NO.:  [id number]          PATIENT TYPE:  INP   LOCATION:  2003                         FACILITY:  [facility]   PHYSICIAN:  Kerin Perna, M.D.  DATE OF BIRTH:  [birth year]   DATE OF CONSULTATION:  10/08/2005  DATE OF DISCHARGE:                                   CONSULTATION   REQUESTING PHYSICIAN:  Colleen Can. Deborah Chalk, M.D.   PRIMARY CARDIOLOGIST:  Cassell Clement, M.D.   PRIMARY CARE PHYSICIAN:  Nadine Counts, M.D., [place].   REASON FOR CONSULTATION:  Severe two vessel coronary artery disease,  subendocardial MI, unstable angina.   CHIEF COMPLAINT:  Chest pain and shortness of breath.   HISTORY OF PRESENT ILLNESS:  I was asked to evaluate this 77 year old white  male from [place], who was admitted to the hospital via the emergency  department earlier today for sudden onset of early morning chest pain with  shortness of breath.  The patient felt he had a low grade associated fever  and presented to the emergency department at [facility], where a  spiral CT scan ruled out pulmonary embolus.  His EKG showed ST segment  changes, and his troponin level was elevated at 4.7.  He was transferred to  [facility].  He underwent diagnostic cardiac catheterization which  showed inferior wall hypokinesis with an EF of 50%.  His LAD had a 90%  proximal stenosis.  The right coronary was chronically occluded with  collateralization.  The left circumflex had an 80-90% stenosis.  Because of  his ostial LAD stenosis and severe two vessel disease, he was felt to be a  candidate for surgical revascularization.  The patient is currently stable  on Lovenox and comfortable following cardiac catheterization.   PAST MEDICAL HISTORY:  1. History of paroxysmal atrial fibrillation, on flecainide for over 10      years.  He required DC cardioversion once.  2. History of polymyalgia rheumatica, remote.  3. No known  drug allergies.  4. Hypertension.  5. BPH.  6. Peripheral neuropathy.   HOME MEDICATIONS:  1. Flecainide 50 mg b.i.d.  2. Atenolol 100 mg daily.  3. Maxzide 75/50 mg once daily.  4. Doxazosin 2 mg nightly.  5. Aspirin 325 mg daily.   SOCIAL HISTORY:  The patient is a retired Optician, dispensing and still works part  time.  He does not use alcohol or smoke.  He has three adult children.   FAMILY HISTORY:  Positive for coronary artery disease, myocardial  infarction, and hypertension.   REVIEW OF SYSTEMS:  CONSTITUTIONAL:  Negative for fever or weight loss.  His  weight is stable at 277 pounds.  ENT:  Negative for visual disturbance,  dental complaints, or difficulty swallowing.  THORACIC:  Negative for chest  trauma or abnormal chest x-ray of the pulmonary nodule.  CARDIAC:  Positive  for angina, MI, and three vessel coronary disease with history of atrial  fibrillation.  He has not been on Coumadin for several years.  He has  apparently maintained sinus rhythm, for the most part.  His GI review  is  negative for GERD, hepatitis, or jaundice.  VASCULAR:  Negative for  claudication or DVT.  NEUROLOGIC:  Positive for stocking distribution lower  extremity neuropathy.  No motor weakness.  HEMATOLOGIC:  Negative for  bleeding disorder, blood transfusion.   PHYSICAL EXAMINATION:  VITAL SIGNS: Patient is 6 feet 9, weighs 277 pounds.  Blood pressure 110/70, pulse 80, currently in sinus rhythm, respirations 18.  GENERAL APPEARANCE:  A pleasant, tall white male in his hospital bed  following cardiac cath in no distress.  HEENT:  Normocephalic.  Dentition is adequate.  NECK:  Without JVD, mass, or carotid bruits.  CHEST:  Thorax is without deformity.  Breath sounds are clear and equal.  CARDIAC:  Regular without S3 gallop or murmur.  A soft friction rub was  noted on earlier exam.  ABDOMEN:  Soft and nontender without pulsatile masses.  EXTREMITIES:  No edema, clubbing or cyanosis.  VASCULAR:   Pulses 2+ in all extremities.  He recently had his great toe  toenails removed last week by a podiatrist.  NEUROLOGIC:  Intact but he is restricted to bedrest at this time following  cardiac cath.   LABORATORY DATA:  I reviewed the coronary arteriograms and his laboratory  data.   He has a subendocardial myocardial infarction and has severe three vessel  coronary disease with a history of paroxysmal atrial fibrillation,  maintained for the most part in sinus rhythm on flecainide.   He would benefit from surgical revascularization with bypass grafts to the  LAD, circumflex marginal, and posterior descending.  He would also benefit  from a maze procedure to reduce the future instance of atrial fibrillation  and the associated complications.  I have discussed this operation with the  patient and family, and he is in agreement.  The surgery will be scheduled  for August 29th.  Thank you very much for the consultation.      Kerin Perna, M.D.  Electronically Signed     PV/MEDQ  D:  10/08/2005  T:  10/08/2005  Job:  [id number]

## 2010-06-30 NOTE — H&P (Signed)
NAME:  Ian Edwards, Ian Edwards NO.:  [id number]   MEDICAL RECORD NO.:  [id number]          PATIENT TYPE:  EMS   LOCATION:  MAJO                         FACILITY:  [facility]   PHYSICIAN:  Cassell Clement, M.D. DATE OF BIRTH:  1931/02/27   DATE OF ADMISSION:  10/07/2005  DATE OF DISCHARGE:                                HISTORY & PHYSICAL   CHIEF COMPLAINT:  Chest pain.   HISTORY:  This is a 77 year old Caucasian male from  who is followed  by Dr. Deborah Chalk for cardiology.  He is admitted in transfer from [facility] after presenting there early this morning with chest pain.  The  patient has no prior known coronary artery disease.  He saw Dr. Deborah Chalk in  1996 for atrial fibrillation and required electrical cardioversion and has  been on Tambocor (flecainide) 50 mg twice a day since then.  He was last  seen in our office 10/18/2004.  He had a normal Cardiolite stress test in  2004.  He does not give any history of exertional chest pain.  Several years  ago he took an extensive hiking trip in [place] and did well.  Early this  morning at about 4 or 5:00 a.m. the patient awoke with severe dyspnea and  sharp pleuritic chest pain.  The pain was worse with taking a deep breath  and better when he sat up.  He did have a low grade fever.  He has had no  recent viral illnesses but he does have a remote history of polymyalgia  rheumatica.  He went to [facility] where he had a CT angiogram which  was negative for pulmonary emboli, negative for dissection and did not show  any pericardial effusion or pleural effusion.  His electrocardiogram showed  ST-T wave changes and his troponin level is 7 with unknown CK-MB and he was  transferred by [facility] to [facility].  At that time of admission his chest pain  is at a level of 2 out of 10.   HOME MEDICATIONS:  Flecainide 50 mg b.i.d., atenolol 100 mg daily, generic  Maxzide 75/50 once a day, doxazosin 2 mg p.o. daily, aspirin 325  mg daily.  He has no known drug allergies.   FAMILY HISTORY:  Reveals father died at 65 of heart and kidney failure.  Mother died of heart failure at 92.  He has a brother who is overweight but  has no known heart problems.   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  He does not use alcohol or  tobacco.  He had four children, three of whom are still living.   REVIEW OF SYSTEMS:  GASTROINTESTINAL:  No history of peptic ulcer disease or  GI bleed.  GENITOURINARY:  Reveals a history of slow stream secondary to  BPH.  The patient has no history of known COPD but did notice improvement  after a nebulizer treatment at [facility] this morning.  The patient  does have a history of peripheral neuropathy but does not think that he has  diabetes.   Remainder of review of systems is negative in detail.   EXAM:  VITAL SIGNS:  Blood pressure is 90/64 on IV nitroglycerin, pulse is  80 regular, respirations normal.  O2 sat is 95% on 2 liters a minute.  HEENT:  Jugular venous pressure normal.  Carotids normal.  Thyroid normal.  CHEST:  Is clear to percussion and auscultation without any pleural friction  rub.  HEART:  When he is supine, reveals a three component soft pericardial  friction rub.  ABDOMENL:  Is soft and nontender.  EXTREMITIES:  Show no edema.  He has good pedal pulses.  He did have his  toenails removed from both big toes last week by Dr. Leeanne Deed, his  podiatrist.   LABS FROM [facility]:  Include a total CK of 257, troponin I 7.56,  blood sugar of 137, BUN 25, SGOT 60.   DIAGNOSTIC IMPRESSION:  1. Chest pain characteristics suggesting acute pericarditis.  Rule out      recent non-Q myocardial infarction with elevated troponin.  2. Essential hypertension.  3. Past history of atrial fibrillation on long-term  flecainide without      recurrence since 1996.   DISPOSITION:  We are admitting to telemetry or subacute, will get serial  enzymes and EKGs.  Will start him on anti-inflammatory with ibuprofen 400 mg  p.o. every 6 hours.  We will cover his GI tract with Protonix, will continue  IV nitroglycerin and we will continue medical dose Lovenox but not full dose  Lovenox at this point.  Will consider cardiac catheterization if symptoms  persist or diagnosis remains somewhat unclear. We will continue p.r.n.  Xopenex nebulizer treatments since that helped him considerably at [facility].  In view of his low blood pressure will hold his flecainide for  now.           ______________________________  Cassell Clement, M.D.     TB/MEDQ  D:  10/07/2005  T:  10/07/2005  Job:  [id number]   cc:   Colleen Can. Deborah Chalk, M.D.  Nadine Counts

## 2010-11-30 ENCOUNTER — Encounter: Payer: Self-pay | Admitting: Cardiology

## 2010-11-30 ENCOUNTER — Ambulatory Visit (INDEPENDENT_AMBULATORY_CARE_PROVIDER_SITE_OTHER): Payer: Medicare Other | Admitting: Cardiology

## 2010-11-30 DIAGNOSIS — I25810 Atherosclerosis of coronary artery bypass graft(s) without angina pectoris: Secondary | ICD-10-CM

## 2010-11-30 DIAGNOSIS — Z951 Presence of aortocoronary bypass graft: Secondary | ICD-10-CM

## 2010-11-30 DIAGNOSIS — I4891 Unspecified atrial fibrillation: Secondary | ICD-10-CM

## 2010-11-30 DIAGNOSIS — E785 Hyperlipidemia, unspecified: Secondary | ICD-10-CM

## 2010-11-30 DIAGNOSIS — Z9889 Other specified postprocedural states: Secondary | ICD-10-CM

## 2010-11-30 DIAGNOSIS — R0602 Shortness of breath: Secondary | ICD-10-CM

## 2010-11-30 LAB — BASIC METABOLIC PANEL
BUN: 21 mg/dL (ref 6–23)
Calcium: 9.1 mg/dL (ref 8.4–10.5)
Creatinine, Ser: 1.2 mg/dL (ref 0.4–1.5)
GFR: 63.51 mL/min (ref >60.00)

## 2010-11-30 LAB — HEPATIC FUNCTION PANEL
ALT: 14 U/L (ref 0–53)
AST: 19 U/L (ref 0–37)
Alkaline Phosphatase: 94 U/L (ref 39–117)
Bilirubin, Direct: 0.2 mg/dL (ref 0.0–0.3)
Total Bilirubin: 0.8 mg/dL (ref 0.3–1.2)

## 2010-11-30 LAB — BRAIN NATRIURETIC PEPTIDE: Pro B Natriuretic peptide (BNP): 84 pg/mL (ref 0.0–100.0)

## 2010-11-30 LAB — LIPID PANEL: Cholesterol: 138 mg/dL (ref 0–200)

## 2010-11-30 NOTE — Progress Notes (Signed)
PCP: Dr. Ivory Broad [facility])  77 yo with history of chronic atrial fibrillation and CAD s/p CABG presents for cardiology followup.  Patient has gait instability from peripheral neuropathy and prior stroke affecting his right leg.  He is rather inactive and is afraid of falling.  He tends to use a motorized scooter when he has to go long distances.  He uses a walker in the house.  He has bothersome bilateral knee pain.  No chest pain. Patient is short of breath after walking about 100 feet chronically, but as above, he is not very active.    ECG: Atrial fibrillation at 64, nonspecific T wave changes.   PMH: 1. Peripheral neuropathy with gait instability. 2. Chronic atrial fibrillation: On coumadin.  He had MAZE and LA appendage ligation with CABG.  3. CAD: s/p CABG 2007 with LIMA-LAD, SVG-D, SVG-CFX, SVG-PDA.  Cardiolite 6/10: EF 57%, normal 4. CVA: 8/09 (after LA appendage was ligated).  Left parietal with some right leg weakness.  5. Hyperlipidemia 6. BPH 7. HTN 8. OA 9. Echo (11/11): EF 55-60%, mild LV hypertrophy, mild MR, mild AI, moderate to severe LAE, PA systolic pressure 49 mmHg.   SH: Retired [employer].  Lives with wife in [place].  4 children.  Nonsmoker.   FH: Noncontributory.   ROS: All systems reviewed and negative except as per HPI.   Current Outpatient Prescriptions  Medication Sig Dispense Refill  . aspirin 81 MG tablet Take 81 mg by mouth daily.        . carvedilol (COREG) 12.5 MG tablet Take two times a day      . Cetirizine HCl (ZYRTEC PO) Take by mouth as needed.        . COD LIVER OIL PO Take by mouth daily.        Marland Kitchen diltiazem (DILACOR XR) 180 MG 24 hr capsule Take 180 mg by mouth daily.        Marland Kitchen doxazosin (CARDURA) 2 MG tablet TAKE 1 TABLET EVERY DAY  90 tablet  12  . folic acid (GNP FOLIC ACID) 400 MCG tablet Take 400 mcg by mouth daily.        . furosemide (LASIX) 40 MG tablet Take 40 mg by mouth daily.        Chilton Si Tea, Camillia sinensis, (GREEN TEA  PO) Take 50 mg by mouth daily.        . Lactobacillus (ACIDOPHILUS PO) Take by mouth daily.        Marland Kitchen lisinopril (PRINIVIL,ZESTRIL) 20 MG tablet Take 20 mg by mouth daily.        . Misc Natural Products (COLON CLEANSER PO) Take by mouth as needed.        . Nutritional Supplements (FRUIT & VEGETABLE DAILY PO) Take 750 mg by mouth daily.        . potassium chloride SA (K-DUR,KLOR-CON) 20 MEQ tablet Take 20 mEq by mouth 3 (three) times daily.       Marland Kitchen rOPINIRole (REQUIP) 0.25 MG tablet Take 1 tablet by mouth. 1 to 3 hours before bedtime      . simvastatin (ZOCOR) 40 MG tablet Take 40 mg by mouth at bedtime.        Marland Kitchen warfarin (COUMADIN) 5 MG tablet Take 5 mg by mouth daily. As directed         BP 118/70  Pulse 64  Ht 6\' 6"  (1.981 m)  Wt 258 lb (117.028 kg)  BMI 29.81 kg/m2 General: NAD Neck: No JVD, no  thyromegaly or thyroid nodule.  Lungs: Clear to auscultation bilaterally with normal respiratory effort. CV: Nondisplaced PMI.  Heart irregular S1/S2, no S3/S4, no murmur.  No peripheral edema.  No carotid bruit.  Normal pedal pulses.  Abdomen: Soft, nontender, no hepatosplenomegaly, no distention.   Neurologic: Alert and oriented x 3.  Psych: Normal affect. Extremities: No clubbing or cyanosis.

## 2010-11-30 NOTE — Patient Instructions (Signed)
Your physician recommends that you have  lab work today--lipid profile/liver profile/BMP/BNP 427.31  414.05  Your physician wants you to follow-up in: 6 months with Dr Shirlee Latch. (April 2013). You will receive a reminder letter in the mail two months in advance. If you don't receive a letter, please call our office to schedule the follow-up appointment.

## 2010-12-01 ENCOUNTER — Telehealth: Payer: Self-pay | Admitting: *Deleted

## 2010-12-01 ENCOUNTER — Other Ambulatory Visit: Payer: Self-pay | Admitting: *Deleted

## 2010-12-01 DIAGNOSIS — E785 Hyperlipidemia, unspecified: Secondary | ICD-10-CM | POA: Insufficient documentation

## 2010-12-01 DIAGNOSIS — E876 Hypokalemia: Secondary | ICD-10-CM

## 2010-12-01 NOTE — Assessment & Plan Note (Addendum)
Stable with no chest pain.  Continue ASA 81, Coreg, ACEI, and statin.  EF preserved.

## 2010-12-01 NOTE — Assessment & Plan Note (Signed)
Will check lipids/LFTs today.  Goal LDL < 70.

## 2010-12-01 NOTE — Assessment & Plan Note (Signed)
Patient is short of breath after walking about 100 feet.  This is chronic.  He did not appear volume overloaded on exam.  I suspect that deconditioning is playing a significant role here as he is quite inactive.  I have asked him to either walk for 15-20 minutes or ride his exercise bike 5-6 days a week.

## 2010-12-01 NOTE — Assessment & Plan Note (Signed)
Chronic atrial fibrillation with good rate control.  Patient had CVA despite left atrial appendage ligation.  If he needs to come off coumadin in the future, he should be bridged with Lovenox or heparin.

## 2010-12-01 NOTE — Telephone Encounter (Signed)
10/19--pt's lab reviewed and K+ low--spoke with dr Shirlee Latch and he wants pt to increase K+ to daily and f/u with BMET in 2 weeks--appoint made for lab for 11/2--pt aware and will increase KCL to qd and f/u lab--nt

## 2010-12-15 ENCOUNTER — Other Ambulatory Visit (INDEPENDENT_AMBULATORY_CARE_PROVIDER_SITE_OTHER): Payer: Medicare Other | Admitting: *Deleted

## 2010-12-15 DIAGNOSIS — E876 Hypokalemia: Secondary | ICD-10-CM

## 2010-12-15 LAB — BASIC METABOLIC PANEL
BUN: 16 mg/dL (ref 6–23)
Calcium: 9.1 mg/dL (ref 8.4–10.5)
Creatinine, Ser: 1.1 mg/dL (ref 0.4–1.5)
GFR: 71.1 mL/min (ref >60.00)
Glucose, Bld: 111 mg/dL — ABNORMAL HIGH (ref 70–99)
Potassium: 3.9 mEq/L (ref 3.5–5.1)

## 2011-06-15 ENCOUNTER — Ambulatory Visit: Payer: Medicare Other | Admitting: Cardiology

## 2011-06-20 ENCOUNTER — Encounter: Payer: Self-pay | Admitting: *Deleted

## 2011-08-03 ENCOUNTER — Ambulatory Visit (INDEPENDENT_AMBULATORY_CARE_PROVIDER_SITE_OTHER): Payer: Medicare Other | Admitting: Cardiology

## 2011-08-03 ENCOUNTER — Encounter: Payer: Self-pay | Admitting: Cardiology

## 2011-08-03 VITALS — BP 152/92 | HR 79 | Ht 79.0 in | Wt 252.0 lb

## 2011-08-03 DIAGNOSIS — I25810 Atherosclerosis of coronary artery bypass graft(s) without angina pectoris: Secondary | ICD-10-CM

## 2011-08-03 DIAGNOSIS — E785 Hyperlipidemia, unspecified: Secondary | ICD-10-CM

## 2011-08-03 DIAGNOSIS — I4891 Unspecified atrial fibrillation: Secondary | ICD-10-CM

## 2011-08-03 DIAGNOSIS — R0989 Other specified symptoms and signs involving the circulatory and respiratory systems: Secondary | ICD-10-CM

## 2011-08-03 LAB — LIPID PANEL
HDL: 35.1 mg/dL — ABNORMAL LOW (ref >39.00)
Total CHOL/HDL Ratio: 4
Triglycerides: 111 mg/dL (ref 0.0–149.0)
VLDL: 22.2 mg/dL (ref 0.0–40.0)

## 2011-08-03 LAB — BASIC METABOLIC PANEL
BUN: 14 mg/dL (ref 6–23)
CO2: 27 mEq/L (ref 19–32)
Chloride: 106 mEq/L (ref 96–112)
Glucose, Bld: 108 mg/dL — ABNORMAL HIGH (ref 70–99)
Potassium: 3.8 mEq/L (ref 3.5–5.1)
Sodium: 142 mEq/L (ref 135–145)

## 2011-08-03 MED ORDER — FUROSEMIDE 40 MG PO TABS: ORAL_TABLET | ORAL | Status: DC

## 2011-08-03 NOTE — Patient Instructions (Addendum)
Increase lasix(furosemide) to 40mg  in the morning and 20mg  in the afternoon about 4-5 pm. This will be one 40mg  tablet in the morning and one-half 40mg  tablet in the afternoon about 4-5 pm.  Your physician recommends that you have a FASTING lipid profile /BMET/BNP today.  Your physician has requested that you have an echocardiogram. Echocardiography is a painless test that uses sound waves to create images of your heart. It provides your doctor with information about the size and shape of your heart and how well your heart's chambers and valves are working. This procedure takes approximately one hour. There are no restrictions for this procedure.  Your physician has requested that you have a lexiscan myoview. For further information please visit [email or web address] Please follow instruction sheet, as given.   Your physician recommends that you schedule a follow-up appointment in: 2 weeks with Dr Shirlee Latch.  Your physician recommends that you return for lab work in: 2 weeks when you see Dr Shirlee Latch. BMET

## 2011-08-04 NOTE — Assessment & Plan Note (Signed)
Check lipids/LFTs with goal LDL < 70.  

## 2011-08-04 NOTE — Assessment & Plan Note (Signed)
Worsened exertional dyspnea recently with some orthopnea.  Exam is difficult but suggestive of some increased volume.  Given history of CABG and exertional symptoms, I will get a Lexiscan myoview (would not be able to walk on treadmill).  I will check a BNP and will increase his Lasix to 40 qam, 20 qpm to see if this helps symptoms.  I will also get an echo to assess LV and RV function.  He does not have a history of smoking or COPD.  No fever/chills/cough to suggest PNA (and symptoms have been gradual in onset over weeks).  It is possible that he is simply becoming more deconditioned (he is pretty inactive at baseline).

## 2011-08-04 NOTE — Assessment & Plan Note (Signed)
Chronic atrial fibrillation with good rate control.  Patient had CVA despite left atrial appendage ligation.  If he needs to come off coumadin in the future, he should be bridged with Lovenox or heparin.   

## 2011-08-04 NOTE — Progress Notes (Signed)
Patient ID: Ian Edwards, male   DOB: December 24, 1931, 78 y.o.   MRN: [id number] PCP: Dr. Ivory Broad (Rosalita Levan)  78 yo with history of chronic atrial fibrillation and CAD s/p CABG presents for cardiology followup.  Patient has gait instability from peripheral neuropathy and prior stroke affecting his right leg.  He is rather inactive and is afraid of falling.  He tends to use a motorized scooter when he has to go long distances.  He uses a walker in the house.  He has bothersome bilateral knee pain.  No chest pain.   Recently, patient has noted a worsening of his chronic exertional dyspnea.  He has been more short of breath for a few weeks, noting this just putting on his clothes in the morning.  Some orthopnea, using 2-3 pillows.  He is walking shorter distances due to breathing difficulties (house to car is difficult).  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    ECG: Atrial fibrillation, mildly prolonged QT interval  Labs (10/12): LDL 82, HDL 36 Labs (11/12): K 3.9, creatinine 1.1  PMH: 1. Peripheral neuropathy with gait instability. 2. Chronic atrial fibrillation: On coumadin.  He had MAZE and LA appendage ligation with CABG.  3. CAD: s/p CABG 2007 with LIMA-LAD, SVG-D, SVG-CFX, SVG-PDA.  Cardiolite 6/10: EF 57%, normal 4. CVA: 8/09 (after LA appendage was ligated).  Left parietal with some right leg weakness.  5. Hyperlipidemia 6. BPH 7. HTN 8. OA 9. Echo (11/11): EF 55-60%, mild LV hypertrophy, mild MR, mild AI, moderate to severe LAE, PA systolic pressure 49 mmHg.   SH: Retired [employer].  Lives with wife in [place].  4 children.  Nonsmoker.   FH: Noncontributory.   ROS: All systems reviewed and negative except as per HPI.   Current Outpatient Prescriptions  Medication Sig Dispense Refill  . aspirin 81 MG tablet Take 81 mg by mouth daily.        . carvedilol (COREG) 12.5 MG tablet Take two times a day      . COD LIVER OIL PO Take by mouth daily.        Marland Kitchen  doxazosin (CARDURA) 2 MG tablet TAKE 1 TABLET EVERY DAY  90 tablet  12  . folic acid (GNP FOLIC ACID) 400 MCG tablet Take 400 mcg by mouth daily.        Marland Kitchen lisinopril (PRINIVIL,ZESTRIL) 20 MG tablet Take 20 mg by mouth daily.        . Misc Natural Products (COLON CLEANSER PO) Take by mouth as needed.        . Multiple Vitamins-Minerals (CENTRUM SILVER PO) Take by mouth.      . Nutritional Supplements (FRUIT & VEGETABLE DAILY PO) Take 750 mg by mouth daily.        . potassium chloride SA (K-DUR,KLOR-CON) 20 MEQ tablet Take 20 mEq by mouth 3 (three) times daily.       Marland Kitchen rOPINIRole (REQUIP) 0.25 MG tablet Take 1 tablet by mouth. 1 to 3 hours before bedtime      . simvastatin (ZOCOR) 20 MG tablet Take 1 tablet by mouth daily.      Marland Kitchen warfarin (COUMADIN) 5 MG tablet Take 5 mg by mouth daily. As directed       . furosemide (LASIX) 40 MG tablet Take 1 tablet in the morning and 1/2 tablet in the afternoon about 4-5pm.  45 tablet  6    BP 152/92  Pulse 79  Ht 6\' 7"  (2.007 m)  Wt 114.306 kg (252 lb)  BMI 28.39 kg/m2 General: NAD Neck: No JVD, no thyromegaly or thyroid nodule.  Lungs: Crackles at bases bilaterally.  CV: Nondisplaced PMI.  Heart irregular S1/S2, no S3/S4, 1/6 SEM RUSB.  1+ edema right ankle.  No carotid bruit.  Normal pedal pulses.  Abdomen: Soft, nontender, no hepatosplenomegaly, no distention.   Neurologic: Alert and oriented x 3.  Psych: Normal affect. Extremities: No clubbing or cyanosis.

## 2011-08-06 NOTE — Addendum Note (Signed)
Addended by: Reine Just on: 08/06/2011 12:30 PM   Modules accepted: Orders

## 2011-08-08 ENCOUNTER — Ambulatory Visit (HOSPITAL_COMMUNITY): Payer: Medicare Other | Attending: Internal Medicine

## 2011-08-08 DIAGNOSIS — Z8673 Personal history of transient ischemic attack (TIA), and cerebral infarction without residual deficits: Secondary | ICD-10-CM | POA: Insufficient documentation

## 2011-08-08 DIAGNOSIS — I359 Nonrheumatic aortic valve disorder, unspecified: Secondary | ICD-10-CM | POA: Insufficient documentation

## 2011-08-08 DIAGNOSIS — I25810 Atherosclerosis of coronary artery bypass graft(s) without angina pectoris: Secondary | ICD-10-CM

## 2011-08-08 DIAGNOSIS — I059 Rheumatic mitral valve disease, unspecified: Secondary | ICD-10-CM | POA: Insufficient documentation

## 2011-08-08 DIAGNOSIS — R0602 Shortness of breath: Secondary | ICD-10-CM

## 2011-08-08 DIAGNOSIS — D472 Monoclonal gammopathy: Secondary | ICD-10-CM | POA: Insufficient documentation

## 2011-08-08 DIAGNOSIS — G609 Hereditary and idiopathic neuropathy, unspecified: Secondary | ICD-10-CM | POA: Insufficient documentation

## 2011-08-08 DIAGNOSIS — E785 Hyperlipidemia, unspecified: Secondary | ICD-10-CM | POA: Insufficient documentation

## 2011-08-08 DIAGNOSIS — I2510 Atherosclerotic heart disease of native coronary artery without angina pectoris: Secondary | ICD-10-CM | POA: Insufficient documentation

## 2011-08-08 DIAGNOSIS — R0989 Other specified symptoms and signs involving the circulatory and respiratory systems: Secondary | ICD-10-CM | POA: Insufficient documentation

## 2011-08-08 DIAGNOSIS — I517 Cardiomegaly: Secondary | ICD-10-CM | POA: Insufficient documentation

## 2011-08-08 DIAGNOSIS — R0609 Other forms of dyspnea: Secondary | ICD-10-CM | POA: Insufficient documentation

## 2011-08-08 NOTE — Progress Notes (Signed)
Echocardiogram performed.  

## 2011-08-20 ENCOUNTER — Ambulatory Visit (HOSPITAL_COMMUNITY): Payer: Medicare Other | Attending: Cardiology | Admitting: Radiology

## 2011-08-20 VITALS — BP 153/103 | Ht 79.0 in | Wt 248.0 lb

## 2011-08-20 DIAGNOSIS — R079 Chest pain, unspecified: Secondary | ICD-10-CM | POA: Insufficient documentation

## 2011-08-20 DIAGNOSIS — I10 Essential (primary) hypertension: Secondary | ICD-10-CM | POA: Insufficient documentation

## 2011-08-20 DIAGNOSIS — I2510 Atherosclerotic heart disease of native coronary artery without angina pectoris: Secondary | ICD-10-CM

## 2011-08-20 DIAGNOSIS — I25810 Atherosclerosis of coronary artery bypass graft(s) without angina pectoris: Secondary | ICD-10-CM

## 2011-08-20 DIAGNOSIS — R0602 Shortness of breath: Secondary | ICD-10-CM | POA: Insufficient documentation

## 2011-08-20 DIAGNOSIS — I4891 Unspecified atrial fibrillation: Secondary | ICD-10-CM | POA: Insufficient documentation

## 2011-08-20 DIAGNOSIS — R0989 Other specified symptoms and signs involving the circulatory and respiratory systems: Secondary | ICD-10-CM | POA: Insufficient documentation

## 2011-08-20 DIAGNOSIS — R0609 Other forms of dyspnea: Secondary | ICD-10-CM | POA: Insufficient documentation

## 2011-08-20 DIAGNOSIS — E785 Hyperlipidemia, unspecified: Secondary | ICD-10-CM | POA: Insufficient documentation

## 2011-08-20 DIAGNOSIS — Z8673 Personal history of transient ischemic attack (TIA), and cerebral infarction without residual deficits: Secondary | ICD-10-CM | POA: Insufficient documentation

## 2011-08-20 DIAGNOSIS — I059 Rheumatic mitral valve disease, unspecified: Secondary | ICD-10-CM | POA: Insufficient documentation

## 2011-08-20 DIAGNOSIS — I359 Nonrheumatic aortic valve disorder, unspecified: Secondary | ICD-10-CM | POA: Insufficient documentation

## 2011-08-20 MED ORDER — TECHNETIUM TC 99M TETROFOSMIN IV KIT
11.0000 | PACK | Freq: Once | INTRAVENOUS | Status: AC | PRN
  Administered 2011-08-20: 11 via INTRAVENOUS

## 2011-08-20 MED ORDER — TECHNETIUM TC 99M TETROFOSMIN IV KIT
33.0000 | PACK | Freq: Once | INTRAVENOUS | Status: AC | PRN
  Administered 2011-08-20: 33 via INTRAVENOUS

## 2011-08-20 MED ORDER — REGADENOSON 0.4 MG/5ML IV SOLN
0.4000 mg | Freq: Once | INTRAVENOUS | Status: AC
  Administered 2011-08-20: 0.4 mg via INTRAVENOUS

## 2011-08-20 NOTE — Progress Notes (Addendum)
[facility] SITE 3 NUCLEAR MED [address] [place] [address] [phone]  Cardiology Nuclear Med Study  Ian Edwards is a 78 y.o. male     MRN : [id number]     DOB: [birth year]  Procedure Date: 08/20/2011  Nuclear Med Background Indication for Stress Test:  Evaluation for Ischemia and Graft Patency History:  '07 MI-Heart Cath: EF:50%-CABG-6/10 (-) ischemia EF: 57%, 6/13 ECHO: 55-60% mild MR mild AR,Chronic AFIB Cardiac Risk Factors: CVA, Hypertension and Lipids  Symptoms:  Chest Pain, DOE and SOB   Nuclear Pre-Procedure Caffeine/Decaff Intake:  10:00pm NPO After: 11:00pm   Lungs:  clear O2 Sat: 93% on room air. IV 0.9% NS with Angio Cath:  20g  IV Site: R Wrist  IV Started by:  Cathlyn Parsons, RN  Chest Size (in):  52 Cup Size: n/a  Height: 6\' 7"  (2.007 m)  Weight:  248 lb (112.492 kg)  BMI:  Body mass index is 27.94 kg/(m^2). Tech Comments:  Coreg taken at 0730 today    Nuclear Med Study 1 or 2 day study: 1 day  Stress Test Type:  Lexiscan  Reading MD: Cassell Clement, MD  Order Authorizing Provider:  Fransico Meadow  Resting Radionuclide: Technetium 39m Tetrofosmin  Resting Radionuclide Dose: 11.0 mCi   Stress Radionuclide:  Technetium 66m Tetrofosmin  Stress Radionuclide Dose: 33.0 mCi           Stress Protocol Rest HR: 76 Stress HR: 90  Rest BP: 152/103 Stress BP: 141/80  Exercise Time (min): n/a METS: n/a   Predicted Max HR: 142 bpm % Max HR: 63.38 bpm Rate Pressure Product: 21308   Dose of Adenosine (mg):  n/a Dose of Lexiscan: 0.4 mg  Dose of Atropine (mg): n/a Dose of Dobutamine: n/a mcg/kg/min (at max HR)  Stress Test Technologist: Milana Na, EMT-P  Nuclear Technologist:  Domenic Polite, CNMT     Rest Procedure:  Myocardial perfusion imaging was performed at rest 45 minutes following the intravenous administration of Technetium 14m Tetrofosmin. Rest ECG: Atrial Fibrilliation  Stress Procedure:  The patient  received IV Lexiscan 0.4 mg over 15-seconds.  Technetium 51m Tetrofosmin injected at 30-seconds.  There were no significant changes with Lexiscan.  Quantitative spect images were obtained after a 45 minute delay. Stress ECG: No significant change from baseline ECG  QPS Raw Data Images:  Normal; no motion artifact; normal heart/lung ratio. Stress Images:  Normal homogeneous uptake in all areas of the myocardium. Rest Images:  Normal homogeneous uptake in all areas of the myocardium. Subtraction (SDS):  No evidence of ischemia. Transient Ischemic Dilatation (Normal <1.22):  0.88 Lung/Heart Ratio (Normal <0.45):  0.24  Quantitative Gated Spect Images QGS EDV:  n/a ml QGS ESV:  n/a ml  Impression Exercise Capacity:  Lexiscan with no exercise. BP Response:  Hypotensive blood pressure response. Clinical Symptoms:  No chest pain. ECG Impression:  No significant ST segment change suggestive of ischemia. Comparison with Prior Nuclear Study: No images to compare  Overall Impression:  Low risk stress nuclear study.  No evidence of ischemia or infarct.  LV Ejection Fraction: Study not gated.  LV Wall Motion:  No LV function data because non-gated study (atrial fib).  Thomas Brackbill  Normal study. Please tell patient.   Marca Ancona 08/21/2011

## 2011-08-21 NOTE — Progress Notes (Signed)
Pt.notified

## 2011-08-24 ENCOUNTER — Encounter: Payer: Self-pay | Admitting: Cardiology

## 2011-08-24 ENCOUNTER — Ambulatory Visit (INDEPENDENT_AMBULATORY_CARE_PROVIDER_SITE_OTHER): Payer: Medicare Other | Admitting: Cardiology

## 2011-08-24 VITALS — BP 130/82 | HR 74 | Ht 79.0 in | Wt 252.0 lb

## 2011-08-24 DIAGNOSIS — I509 Heart failure, unspecified: Secondary | ICD-10-CM

## 2011-08-24 DIAGNOSIS — E785 Hyperlipidemia, unspecified: Secondary | ICD-10-CM

## 2011-08-24 DIAGNOSIS — I5032 Chronic diastolic (congestive) heart failure: Secondary | ICD-10-CM

## 2011-08-24 DIAGNOSIS — I4891 Unspecified atrial fibrillation: Secondary | ICD-10-CM

## 2011-08-24 DIAGNOSIS — I5020 Unspecified systolic (congestive) heart failure: Secondary | ICD-10-CM

## 2011-08-24 DIAGNOSIS — Z951 Presence of aortocoronary bypass graft: Secondary | ICD-10-CM

## 2011-08-24 DIAGNOSIS — I2510 Atherosclerotic heart disease of native coronary artery without angina pectoris: Secondary | ICD-10-CM

## 2011-08-24 LAB — BASIC METABOLIC PANEL
BUN: 18 mg/dL (ref 6–23)
Chloride: 105 mEq/L (ref 96–112)
Potassium: 3.4 mEq/L — ABNORMAL LOW (ref 3.5–5.3)
Sodium: 144 mEq/L (ref 135–145)

## 2011-08-24 MED ORDER — FUROSEMIDE 40 MG PO TABS: 40.0000 mg | ORAL_TABLET | Freq: Two times a day (BID) | ORAL | Status: DC

## 2011-08-24 NOTE — Patient Instructions (Addendum)
Stop aspirin.  Increase Lasix(furosemide) to 40mg  twice a day.  Your physician recommends that you have lab work today--BMET/BNP  Your physician recommends that you schedule a follow-up appointment in: about 5 weeks with Dr Shirlee Latch.

## 2011-08-25 LAB — BRAIN NATRIURETIC PEPTIDE: Brain Natriuretic Peptide: 138.5 pg/mL — ABNORMAL HIGH (ref 0.0–100.0)

## 2011-08-26 DIAGNOSIS — I5032 Chronic diastolic (congestive) heart failure: Secondary | ICD-10-CM | POA: Insufficient documentation

## 2011-08-26 NOTE — Progress Notes (Signed)
Patient ID: Ian Edwards, male   DOB: 09-Aug-1931, 78 y.o.   MRN: [id number] PCP: Dr. Ivory Broad (Rosalita Levan)  78 yo with history of chronic atrial fibrillation and CAD s/p CABG presents for cardiology followup.  Patient has gait instability from peripheral neuropathy and prior stroke affecting his right leg.  He is rather inactive and is afraid of falling.  He tends to use a motorized scooter when he has to go long distances.  He uses a walker in the house.  He has bothersome bilateral knee pain.  No chest pain.   Recently, patient has noted a worsening of his chronic exertional dyspnea.  He has been more short of breath for a few weeks, noting this just putting on his clothes in the morning.  Some orthopnea, using 2-3 pillows.  He is walking shorter distances due to breathing difficulties (house to car is difficult).  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  At last appointment, I had him increase his Lasix to 40 qam, 20 qpm.  His weight is stable and this has not made a lot of difference in his symptoms.  I had him do a [employer] which showed no evidence for ischemia or infarction.  Echo showed normal EF with moderate (grade II) diastolic dysfunction.  BNP was mildly elevated.   Labs (10/12): LDL 82, HDL 36 Labs (11/12): K 3.9, creatinine 1.1 Labs (6/13): K 3.8, creatinine 1.1, LDL 76, HDL 35, proBNP 128  PMH: 1. Peripheral neuropathy with gait instability. 2. Chronic atrial fibrillation: On coumadin.  He had MAZE and LA appendage ligation with CABG.  3. CAD: s/p CABG 2007 with LIMA-LAD, SVG-D, SVG-CFX, SVG-PDA.  Cardiolite 6/10: EF 57%, normal. Lexiscan myoview (7/13): No ischemia or infarction.  4. CVA: 8/09 (after LA appendage was ligated).  Left parietal with some right leg weakness.  5. Hyperlipidemia 6. BPH 7. HTN 8. OA 9. Chronic diastolic CHF: Echo (11/11): EF 55-60%, mild LV hypertrophy, mild MR, mild AI, moderate to severe LAE, PA systolic pressure 49 mmHg.  Echo  (6/13): EF 55-60%, moderate LVH, grade II diastolic dysfunction, mild MR, mild AI, PA systolic pressure 42 mmHg.   SH: Retired [employer].  Lives with wife in [place].  4 children.  Nonsmoker.   FH: No premature CAD.   ROS: All systems reviewed and negative except as per HPI.   Current Outpatient Prescriptions  Medication Sig Dispense Refill  . carvedilol (COREG) 12.5 MG tablet Take two times a day      . COD LIVER OIL PO Take by mouth daily.        Marland Kitchen doxazosin (CARDURA) 2 MG tablet TAKE 1 TABLET EVERY DAY  90 tablet  12  . folic acid (GNP FOLIC ACID) 400 MCG tablet Take 400 mcg by mouth daily.        Marland Kitchen lisinopril (PRINIVIL,ZESTRIL) 20 MG tablet Take 20 mg by mouth daily.        . Misc Natural Products (COLON CLEANSER PO) Take by mouth as needed.        . Multiple Vitamins-Minerals (CENTRUM SILVER PO) Take by mouth.      . Nutritional Supplements (FRUIT & VEGETABLE DAILY PO) Take 750 mg by mouth daily.        . potassium chloride SA (K-DUR,KLOR-CON) 20 MEQ tablet Take 20 mEq by mouth 3 (three) times daily.       Marland Kitchen rOPINIRole (REQUIP) 0.25 MG tablet Take 1 tablet by mouth. 1 to 3 hours  before bedtime      . simvastatin (ZOCOR) 20 MG tablet Take 1 tablet by mouth daily.      Marland Kitchen warfarin (COUMADIN) 5 MG tablet Take 5 mg by mouth daily. As directed       . furosemide (LASIX) 40 MG tablet Take 1 tablet (40 mg total) by mouth 2 (two) times daily.  60 tablet  6    BP 130/82  Pulse 74  Ht 6\' 7"  (2.007 m)  Wt 252 lb (114.306 kg)  BMI 28.39 kg/m2  SpO2 94% General: NAD Neck: JVP 8 cm, no thyromegaly or thyroid nodule.  Lungs: Crackles at bases bilaterally.  CV: Nondisplaced PMI.  Heart irregular S1/S2, no S3/S4, 1/6 SEM RUSB.  No edema.  No carotid bruit.  Normal pedal pulses.  Abdomen: Soft, nontender, no hepatosplenomegaly, no distention.   Neurologic: Alert and oriented x 3.  Psych: Normal affect. Extremities: No clubbing or cyanosis.

## 2011-08-26 NOTE — Assessment & Plan Note (Signed)
Echo consistent with diastolic CHF, BNP mildly elevated.  Lexiscan myoview showed no evidence for ischemia or infarction.  I suspect that his dyspneic symptoms are at least in part due to diastolic CHF with volume overload.  I will increase his Lasix to 40 mg bid.  BMET/BNP today and will have him return in a month.

## 2011-08-26 NOTE — Assessment & Plan Note (Signed)
Chronic atrial fibrillation with good rate control.  Patient had CVA despite left atrial appendage ligation.  If he needs to come off coumadin in the future, he should be bridged with Lovenox or heparin.  We have talked about switching to a novel oral anticoagulant.  He looked into Xarelto but it was going to be too expensive for him. 

## 2011-08-26 NOTE — Assessment & Plan Note (Signed)
LDL near goal (< 70) in 6/13.

## 2011-08-26 NOTE — Assessment & Plan Note (Signed)
Lexiscan myoview negative earlier this month.  Continue current meds for secondary prevention with exception of ASA.  ASA can be stopped as he is also taking warfarin and there is no advantage in stable coronary disease to taking the combination of ASA and warfarin over warfarin alone.

## 2011-08-27 ENCOUNTER — Other Ambulatory Visit: Payer: Self-pay | Admitting: *Deleted

## 2011-08-27 MED ORDER — POTASSIUM CHLORIDE CRYS ER 20 MEQ PO TBCR: EXTENDED_RELEASE_TABLET | ORAL | Status: AC

## 2011-10-05 ENCOUNTER — Ambulatory Visit (INDEPENDENT_AMBULATORY_CARE_PROVIDER_SITE_OTHER): Payer: Medicare Other | Admitting: Cardiology

## 2011-10-05 ENCOUNTER — Encounter: Payer: Self-pay | Admitting: Cardiology

## 2011-10-05 VITALS — BP 138/82 | HR 86 | Ht 79.0 in | Wt 253.0 lb

## 2011-10-05 DIAGNOSIS — I4891 Unspecified atrial fibrillation: Secondary | ICD-10-CM

## 2011-10-05 DIAGNOSIS — I5032 Chronic diastolic (congestive) heart failure: Secondary | ICD-10-CM

## 2011-10-05 DIAGNOSIS — I10 Essential (primary) hypertension: Secondary | ICD-10-CM

## 2011-10-05 DIAGNOSIS — I509 Heart failure, unspecified: Secondary | ICD-10-CM

## 2011-10-05 DIAGNOSIS — Z951 Presence of aortocoronary bypass graft: Secondary | ICD-10-CM

## 2011-10-05 MED ORDER — LISINOPRIL 40 MG PO TABS: 40.0000 mg | ORAL_TABLET | Freq: Every day | ORAL | Status: DC

## 2011-10-05 NOTE — Patient Instructions (Addendum)
Increase lisinopril to 40mg  daily. You can take two 20mg  tablets daily at the same time and use your current supply.  Your physician recommends that you have  lab work in: 2 weeks--BMET/BNP. You have the order. Please fax the results to Dr Shirlee Latch [phone].  Take and record your blood pressure daily. I will call you in about  2 weeks and get the readings. Luana Shu [phone]  Your physician wants you to follow-up in: 6 months with Dr Shirlee Latch. (February 2014). You will receive a reminder letter in the mail two months in advance. If you don't receive a letter, please call our office to schedule the follow-up appointment.

## 2011-10-07 NOTE — Assessment & Plan Note (Signed)
He does not feel much better but he does not appear volume overloaded on exam.  Continue current Lasix dose.  He needs to try to exercise more.  I would like him to think about doing some walking in [employer] or the grocery store.

## 2011-10-07 NOTE — Assessment & Plan Note (Signed)
Chronic atrial fibrillation with good rate control.  Patient had CVA despite left atrial appendage ligation.  If he needs to come off coumadin in the future, he should be bridged with Lovenox or heparin.  We have talked about switching to a novel oral anticoagulant.  He looked into Xarelto but it was going to be too expensive for him.

## 2011-10-07 NOTE — Progress Notes (Signed)
Patient ID: Ian Edwards, male   DOB: [birth year], 78 y.o.   MRN: [id number] PCP: Dr. Ivory Broad (Rosalita Levan)  78 yo with history of chronic atrial fibrillation and CAD s/p CABG presents for cardiology followup.  Patient has gait instability from peripheral neuropathy and prior stroke affecting his right leg.  He is rather inactive and is afraid of falling.  He tends to use a motorized scooter when he has to go long distances.  He uses a walker in the house.  He has bothersome bilateral knee pain.  No chest pain.   Recently, patient had noted a worsening of his chronic exertional dyspnea.   Some orthopnea, using 2-3 pillows.  He is walking shorter distances due to breathing difficulties (house to car is difficult).  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  At last appointment, I had him increase his Lasix to 40 mg bid.  I had him do a [employer] which showed no evidence for ischemia or infarction.  Echo showed normal EF with moderate (grade II) diastolic dysfunction.  BNP was mildly elevated.  His weight is stable.  He is trying to walk a little more, but his breathing is about the same.  No chest pain.  No ankle edema.    Labs (10/12): LDL 82, HDL 36 Labs (11/12): K 3.9, creatinine 1.1 Labs (6/13): K 3.8, creatinine 1.1, LDL 76, HDL 35, proBNP 128 Labs (7/13): K 3.4, creatinine 1.11, BNP 138.5  PMH: 1. Peripheral neuropathy with gait instability. 2. Chronic atrial fibrillation: On coumadin.  He had MAZE and LA appendage ligation with CABG.  3. CAD: s/p CABG 2007 with LIMA-LAD, SVG-D, SVG-CFX, SVG-PDA.  Cardiolite 6/10: EF 57%, normal. Lexiscan myoview (7/13): No ischemia or infarction.  4. CVA: 8/09 (after LA appendage was ligated).  Left parietal with some right leg weakness.  5. Hyperlipidemia 6. BPH 7. HTN 8. OA 9. Chronic diastolic CHF: Echo (11/11): EF 55-60%, mild LV hypertrophy, mild MR, mild AI, moderate to severe LAE, PA systolic pressure 49 mmHg.  Echo (6/13): EF  55-60%, moderate LVH, grade II diastolic dysfunction, mild MR, mild AI, PA systolic pressure 42 mmHg.   SH: Retired [employer].  Lives with wife in [place].  4 children.  Nonsmoker.   FH: No premature CAD.   ROS: All systems reviewed and negative except as per HPI.   Current Outpatient Prescriptions  Medication Sig Dispense Refill  . carvedilol (COREG) 12.5 MG tablet Take two times a day      . COD LIVER OIL PO Take by mouth daily.        Marland Kitchen doxazosin (CARDURA) 2 MG tablet TAKE 1 TABLET EVERY DAY  90 tablet  12  . folic acid (GNP FOLIC ACID) 400 MCG tablet Take 400 mcg by mouth daily.        . furosemide (LASIX) 40 MG tablet Take 1 tablet (40 mg total) by mouth 2 (two) times daily.  60 tablet  6  . lisinopril (PRINIVIL,ZESTRIL) 40 MG tablet Take 1 tablet (40 mg total) by mouth daily.  90 tablet  3  . Misc Natural Products (COLON CLEANSER PO) Take by mouth as needed.        . Multiple Vitamins-Minerals (CENTRUM SILVER PO) Take by mouth.      . Nutritional Supplements (FRUIT & VEGETABLE DAILY PO) Take 750 mg by mouth daily.        . potassium chloride SA (K-DUR,KLOR-CON) 20 MEQ tablet Take 2 tablets two times  a day  120 tablet  6  . rOPINIRole (REQUIP) 0.25 MG tablet Take 1 tablet by mouth. 1 to 3 hours before bedtime      . simvastatin (ZOCOR) 20 MG tablet Take 1 tablet by mouth daily.      Marland Kitchen warfarin (COUMADIN) 5 MG tablet Take 5 mg by mouth daily. As directed         BP 138/82  Pulse 86  Ht 6\' 7"  (2.007 m)  Wt 253 lb (114.76 kg)  BMI 28.50 kg/m2  SpO2 98% General: NAD Neck: JVP 6-7 cm, no thyromegaly or thyroid nodule.  Lungs: Crackles at bases bilaterally.  CV: Nondisplaced PMI.  Heart irregular S1/S2, no S3/S4, 1/6 SEM RUSB.  No edema.  No carotid bruit.  Normal pedal pulses.  Abdomen: Soft, nontender, no hepatosplenomegaly, no distention.   Neurologic: Alert and oriented x 3.  Psych: Normal affect. Extremities: No clubbing or cyanosis.

## 2011-10-07 NOTE — Assessment & Plan Note (Signed)
BP has been running high at home, up to the 170s systolic.  Increase lisinopril to 40 mg daily. He will check BP daily and call us in 2 wks with readings.  BMET/BNP in 2 wks.

## 2011-10-07 NOTE — Assessment & Plan Note (Signed)
Lexiscan myoview negative 7/13.  Continue current meds for secondary prevention.

## 2011-10-17 ENCOUNTER — Encounter: Payer: Self-pay | Admitting: Cardiology

## 2011-10-23 ENCOUNTER — Encounter: Payer: Self-pay | Admitting: Cardiology

## 2011-10-24 ENCOUNTER — Other Ambulatory Visit: Payer: Self-pay | Admitting: *Deleted

## 2011-10-24 ENCOUNTER — Encounter: Payer: Self-pay | Admitting: Cardiology

## 2011-10-24 ENCOUNTER — Telehealth: Payer: Self-pay | Admitting: *Deleted

## 2011-10-24 DIAGNOSIS — I25810 Atherosclerosis of coronary artery bypass graft(s) without angina pectoris: Secondary | ICD-10-CM

## 2011-10-24 DIAGNOSIS — I4891 Unspecified atrial fibrillation: Secondary | ICD-10-CM

## 2011-10-24 MED ORDER — AMLODIPINE BESYLATE 10 MG PO TABS: 10.0000 mg | ORAL_TABLET | Freq: Every day | ORAL | Status: AC

## 2011-10-24 NOTE — Telephone Encounter (Signed)
Pt brought in BP readings. Reviewed by Dr Shirlee Latch. He recommended pt increase amlodipine to 10mg  daily. Pt to call in 2 weeks with BP readings.

## 2011-12-05 ENCOUNTER — Telehealth: Payer: Self-pay | Admitting: Cardiology

## 2011-12-05 NOTE — Telephone Encounter (Signed)
Pt has gotten his b/p down and needs to give some information to the nurse

## 2011-12-05 NOTE — Telephone Encounter (Signed)
Pt calls with current blood pressure reading after a 3 weeks stay in [place], [place] "that vacation really relaxed me. My blood pressure stayed down"  Currently blood pressure 126/74.   Mylo Red RN

## 2012-01-31 ENCOUNTER — Emergency Department (HOSPITAL_BASED_OUTPATIENT_CLINIC_OR_DEPARTMENT_OTHER)
Admission: EM | Admit: 2012-01-31 | Discharge: 2012-01-31 | Disposition: A | Discharge status: Home / Self Care | Payer: Medicare Other | Attending: Emergency Medicine | Admitting: Emergency Medicine

## 2012-01-31 ENCOUNTER — Emergency Department (HOSPITAL_BASED_OUTPATIENT_CLINIC_OR_DEPARTMENT_OTHER): Payer: Medicare Other

## 2012-01-31 ENCOUNTER — Encounter (HOSPITAL_BASED_OUTPATIENT_CLINIC_OR_DEPARTMENT_OTHER): Payer: Self-pay | Admitting: *Deleted

## 2012-01-31 DIAGNOSIS — IMO0002 Reserved for concepts with insufficient information to code with codable children: Secondary | ICD-10-CM | POA: Insufficient documentation

## 2012-01-31 DIAGNOSIS — W010XXA Fall on same level from slipping, tripping and stumbling without subsequent striking against object, initial encounter: Secondary | ICD-10-CM | POA: Insufficient documentation

## 2012-01-31 DIAGNOSIS — Z8673 Personal history of transient ischemic attack (TIA), and cerebral infarction without residual deficits: Secondary | ICD-10-CM | POA: Insufficient documentation

## 2012-01-31 DIAGNOSIS — Y9289 Other specified places as the place of occurrence of the external cause: Secondary | ICD-10-CM | POA: Insufficient documentation

## 2012-01-31 DIAGNOSIS — M549 Dorsalgia, unspecified: Secondary | ICD-10-CM

## 2012-01-31 DIAGNOSIS — W19XXXA Unspecified fall, initial encounter: Secondary | ICD-10-CM

## 2012-01-31 DIAGNOSIS — Z791 Long term (current) use of non-steroidal anti-inflammatories (NSAID): Secondary | ICD-10-CM | POA: Insufficient documentation

## 2012-01-31 DIAGNOSIS — I10 Essential (primary) hypertension: Secondary | ICD-10-CM | POA: Insufficient documentation

## 2012-01-31 DIAGNOSIS — Z8679 Personal history of other diseases of the circulatory system: Secondary | ICD-10-CM | POA: Insufficient documentation

## 2012-01-31 DIAGNOSIS — Z79899 Other long term (current) drug therapy: Secondary | ICD-10-CM | POA: Insufficient documentation

## 2012-01-31 DIAGNOSIS — Z7901 Long term (current) use of anticoagulants: Secondary | ICD-10-CM | POA: Insufficient documentation

## 2012-01-31 DIAGNOSIS — Y9301 Activity, walking, marching and hiking: Secondary | ICD-10-CM | POA: Insufficient documentation

## 2012-01-31 MED ORDER — HYDROCODONE-ACETAMINOPHEN 5-325 MG PO TABS: 2.0000 | ORAL_TABLET | ORAL | Status: AC | PRN

## 2012-01-31 NOTE — ED Provider Notes (Signed)
Medical screening examination/treatment/procedure(s) were performed by non-physician practitioner and as supervising physician I was immediately available for consultation/collaboration.  Derwood Kaplan, MD 01/31/12 1546

## 2012-01-31 NOTE — ED Notes (Signed)
Tripped and fell in the parking lot at [employer] while getting his wife in the car. Security assisted patient after the fall and ED staff placed him on a long spine board and ccollar.  States his hips hurt from laying on the spine board. No loc. He did not hit his head.

## 2012-01-31 NOTE — ED Notes (Signed)
Pt was walking in parking lot and "stumped toe" falling to ground. Pt denies hitting head, c/o low back pain and sts he fell on right hip. Pt was immobilized and placed on stretcher in parking lot by ED staff and brought to room 9 for evaluation.

## 2012-01-31 NOTE — ED Provider Notes (Signed)
History     CSN: [id number]  Arrival date & time 01/31/12  1213   First MD Initiated Contact with Patient 01/31/12 1224      Chief Complaint  Patient presents with  . Fall    (Consider location/radiation/quality/duration/timing/severity/associated sxs/prior treatment) HPI Comments: Pt was helping his wife in the car and tripped and fell:pt had no dizziness or loc associated with the fall  Patient is a 78 y.o. male presenting with fall. The history is provided by the patient. No language interpreter was used.  Fall The accident occurred less than 1 hour ago. The fall occurred while walking. He landed on concrete. There was no blood loss. Point of impact: right hip. Pain location: lower back. The pain is mild. He was ambulatory at the scene. There was no entrapment after the fall. There was no drug use involved in the accident. There was no alcohol use involved in the accident. Treatment on scene includes a c-collar and a backboard. He has tried nothing for the symptoms.    Past Medical History  Diagnosis Date  . Ischemic heart disease   . Chronic atrial fibrillation   . CVA (cerebrovascular accident)   . HTN (hypertension)   . Right knee pain     Past Surgical History  Procedure Date  . Rotator cuff repair 1996  . Appendectomy 1949  . Ear cyst excision '53 / '54    Family History  Problem Relation Age of Onset  . Heart attack    . Hypertension    . Kidney disease    . Emphysema      History  Substance Use Topics  . Smoking status: Never Smoker   . Smokeless tobacco: Not on file  . Alcohol Use: No      Review of Systems  Constitutional: Negative.   Respiratory: Negative.   Cardiovascular: Negative.     Allergies  Niacin and related  Home Medications   Current Outpatient Rx  Name  Route  Sig  Dispense  Refill  . AMLODIPINE BESYLATE 10 MG PO TABS   Oral   Take 1 tablet (10 mg total) by mouth daily.   90 tablet   3   . CARVEDILOL 25 MG PO TABS       Take two times a day         . COD LIVER OIL PO   Oral   Take by mouth daily.           Marland Kitchen DOXAZOSIN MESYLATE 2 MG PO TABS      TAKE 1 TABLET EVERY DAY   90 tablet   12     NEED REFILLS ASAP PLEASE   . FOLIC ACID 400 MCG PO TABS   Oral   Take 400 mcg by mouth daily.           . FUROSEMIDE 40 MG PO TABS   Oral   Take 1 tablet (40 mg total) by mouth 2 (two) times daily.   60 tablet   6   . LISINOPRIL 40 MG PO TABS   Oral   Take 1 tablet (40 mg total) by mouth daily.   90 tablet   3   . COLON CLEANSER PO   Oral   Take by mouth as needed.           . CENTRUM SILVER PO   Oral   Take by mouth.         . FRUIT & VEGETABLE DAILY PO  Oral   Take 750 mg by mouth daily.           Marland Kitchen POTASSIUM CHLORIDE CRYS ER 20 MEQ PO TBCR      Take 2 tablets two times a day   120 tablet   6   . ROPINIROLE HCL 0.25 MG PO TABS   Oral   Take 1 tablet by mouth. 1 to 3 hours before bedtime         . SIMVASTATIN 20 MG PO TABS   Oral   Take 1 tablet by mouth daily.         . WARFARIN SODIUM 5 MG PO TABS   Oral   Take 5 mg by mouth daily. As directed            BP 139/93  Pulse 82  Temp 97.5 F (36.4 C) (Oral)  Resp 16  Ht 6\' 7"  (2.007 m)  Wt 250 lb (113.399 kg)  BMI 28.16 kg/m2  Physical Exam  Nursing note and vitals reviewed. Constitutional: He is oriented to person, place, and time. He appears well-developed and well-nourished.  HENT:  Head: Normocephalic and atraumatic.  Neck: Neck supple.  Cardiovascular: Normal rate and regular rhythm.   Pulmonary/Chest: Effort normal and breath sounds normal.  Abdominal: Soft.  Musculoskeletal: Normal range of motion.       Cervical back: Normal.       Thoracic back: Normal.       Lumbar back: He exhibits bony tenderness.  Neurological: He is alert and oriented to person, place, and time.  Skin: Skin is warm and dry.  Psychiatric: He has a normal mood and affect.    ED Course  Procedures (including  critical care time)  Labs Reviewed - No data to display Dg Lumbar Spine Complete  01/31/2012  *RADIOLOGY REPORT*  Clinical Data: Traumatic injury with back pain  LUMBAR SPINE - COMPLETE 4+ VIEW  Comparison: 12/20/2009  Findings: There is a compression deformity of L2 which appears chronic in nature and stable from previous exam.  Osteophytic changes are seen.  Mild anterolisthesis of L4 on L5 is noted.  No spondylolysis or spondylolisthesis is seen.  No definitive acute fracture is noted.  IMPRESSION: Chronic compression deformity at L2.  Multilevel degenerative change.   Original Report Authenticated By: Alcide Clever, M.D.    Ct Head Wo Contrast  01/31/2012  *RADIOLOGY REPORT*  Clinical Data: Larey Seat.  CT HEAD WITHOUT CONTRAST  Technique:  Contiguous axial images were obtained from the base of the skull through the vertex without contrast.  Comparison: 12/15/2009  Findings: Atherosclerotic and physiologic intracranial calcifications. Diffuse parenchymal atrophy. Patchy areas of hypoattenuation in deep and periventricular white matter bilaterally. Negative for acute intracranial hemorrhage, mass lesion, acute infarction, midline shift, or mass-effect. Acute infarct may be inapparent on noncontrast CT. Ventricles and sulci symmetric. Bone windows demonstrate no focal lesion.  IMPRESSION:  1. Negative for bleed or other acute intracranial process.  2. Atrophy and nonspecific white matter changes   Original Report Authenticated By: D. Andria Rhein, MD      1. Fall   2. Back pain       MDM  No acute injury noted on imaging:pt is neurovascularly intact:pt is okay to follow up with pcp as needed:pt going to try aleve for pain put if not working pt given a script for hydrocodone        Teressa Lower, NP 01/31/12 1333

## 2012-07-16 ENCOUNTER — Other Ambulatory Visit: Payer: Self-pay | Admitting: Cardiology

## 2012-09-13 ENCOUNTER — Other Ambulatory Visit: Payer: Self-pay | Admitting: Cardiology

## 2012-12-23 ENCOUNTER — Other Ambulatory Visit: Payer: Self-pay

## 2012-12-23 MED ORDER — FUROSEMIDE 40 MG PO TABS: ORAL_TABLET | ORAL | Status: DC

## 2013-01-19 ENCOUNTER — Other Ambulatory Visit: Payer: Self-pay | Admitting: Cardiology

## 2013-02-16 ENCOUNTER — Other Ambulatory Visit: Payer: Self-pay | Admitting: Cardiology

## 2013-04-12 ENCOUNTER — Other Ambulatory Visit: Payer: Self-pay | Admitting: Cardiology

## 2013-04-14 NOTE — Telephone Encounter (Signed)
Not seen since 10/05/11, was supposed to follow up in six months but never did. He is in [place] right now. Ok to refill, and if so how many refills? Please advise. Thanks, MI

## 2013-04-15 NOTE — Telephone Encounter (Signed)
Attempted to reach patient, but number listed is no longer in service. Should I refill one more month or give partial refill with reminder on bottle to make appointment for further refills. Please advise. Thanks, MI

## 2013-04-15 NOTE — Telephone Encounter (Signed)
He needs to make an appt with Dr Aundra Dubin. Once he makes an appt I would give him enough refills to last to appt with Dr Aundra Dubin.

## 2013-04-15 NOTE — Telephone Encounter (Signed)
I would give him 2 weeks and leave message with pharmacy that he needs to call for an appt before we can give him anymore refills. Ask pharmacy if they have a current contact phone number for pt. Thanks

## 2013-07-13 DEATH — deceased
